# Patient Record
Sex: Female | Born: 1990 | Race: White | Hispanic: No | Marital: Married | Smoking: Never smoker
Health system: Southern US, Community
[De-identification: ages and names within clinical notes are randomized; demographics above are authoritative.]

## PROBLEM LIST (undated history)

## (undated) DIAGNOSIS — Z789 Other specified health status: Secondary | ICD-10-CM

## (undated) HISTORY — PX: APPENDECTOMY: SHX54

---

## 2014-04-27 DIAGNOSIS — F32A Depression, unspecified: Secondary | ICD-10-CM | POA: Insufficient documentation

## 2014-04-27 DIAGNOSIS — O99342 Other mental disorders complicating pregnancy, second trimester: Secondary | ICD-10-CM

## 2014-04-27 DIAGNOSIS — F329 Major depressive disorder, single episode, unspecified: Secondary | ICD-10-CM | POA: Insufficient documentation

## 2014-07-03 ENCOUNTER — Observation Stay: Payer: Self-pay | Admitting: Obstetrics & Gynecology

## 2014-07-11 ENCOUNTER — Inpatient Hospital Stay: Payer: Self-pay | Admitting: Obstetrics and Gynecology

## 2014-07-11 LAB — CBC WITH DIFFERENTIAL/PLATELET
BASOS PCT: 0.4 %
Basophil #: 0 10*3/uL (ref 0.0–0.1)
EOS PCT: 0.3 %
Eosinophil #: 0 10*3/uL (ref 0.0–0.7)
HCT: 32.6 % — ABNORMAL LOW (ref 35.0–47.0)
HGB: 10.6 g/dL — AB (ref 12.0–16.0)
Lymphocyte #: 1.2 10*3/uL (ref 1.0–3.6)
Lymphocyte %: 11.1 %
MCH: 25.1 pg — ABNORMAL LOW (ref 26.0–34.0)
MCHC: 32.4 g/dL (ref 32.0–36.0)
MCV: 77 fL — ABNORMAL LOW (ref 80–100)
Monocyte #: 0.8 x10 3/mm (ref 0.2–0.9)
Monocyte %: 7.2 %
Neutrophil #: 8.8 10*3/uL — ABNORMAL HIGH (ref 1.4–6.5)
Neutrophil %: 81 %
PLATELETS: 351 10*3/uL (ref 150–440)
RBC: 4.21 10*6/uL (ref 3.80–5.20)
RDW: 15.6 % — ABNORMAL HIGH (ref 11.5–14.5)
WBC: 10.9 10*3/uL (ref 3.6–11.0)

## 2014-07-12 LAB — GC/CHLAMYDIA PROBE AMP

## 2014-07-13 LAB — HEMATOCRIT: HCT: 24.2 % — ABNORMAL LOW (ref 35.0–47.0)

## 2014-10-21 NOTE — H&P (Signed)
L&D Evaluation:  History Expanded:  HPI 24 yo G1 at 39 weeks w DFM, some abd pains.  No real ctx's, no vaginal bleeding or ROM.  Prenatal Care at [facility].  Plans to adopt out baby.  Late entry to care and depression this pregnancy.   Gravida 1   Term [facility]   [facility]   Presents with decreased fetal movement   Patient's Medical History No Chronic Illness   Patient's Surgical History none   Medications Pre Natal Vitamins   Allergies NKDA   Social History none   Family History Non-Contributory   ROS:  ROS All systems were reviewed.  HEENT, CNS, GI, GU, Respiratory, CV, Renal and Musculoskeletal systems were found to be normal.   Exam:  Vital Signs stable   General no apparent distress   Mental Status clear   Abdomen gravid, non-tender   Estimated Fetal Weight Average for gestational age   Back no CVAT   Edema no edema   Pelvic no external lesions, 1/70/-2   Mebranes Intact   FHT normal rate with no decels, Non-Stress Test R   Ucx absent   Skin dry   Impression:  Impression decreased fetal movement   Plan:  Plan A NST procedure was performed with FHR monitoring and a normal baseline established, appropriate time of 20-40 minutes of evaluation, and accels >2 seen w 15x15 characteristics.  Results show a REACTIVE Non-Stress Test.   Comments No signs or symptoms of labor   Precautions discussed.   Electronic Signatures: Letitia LibraHarris, Cyndal Kasson Paul (MD)  (Signed 21-Jan-16 20:12)  Authored: L&D Evaluation   Last Updated: 21-Jan-16 20:12 by Letitia LibraHarris, Jahziel Sinn Paul (MD)

## 2014-10-21 NOTE — H&P (Signed)
L&D Evaluation:  History Expanded:  HPI 24 yo G1 at 4934w2d gestational age by LMP consistent with 285w4d gestational age, regnancy complicated by some depression during the pregnancy and late entry to care at 24 weeks. She presents with gush of fluid at 145pm today of clear fluid initially and has continued to leak a pink-tinged fluid, contractions starting at about 415pm today.  Apart from the pink-tinged fluid she denies frank bleeding.  Prenatal Care at [facility].  Plans to adopt out baby.   Gravida 1   Term 0   Blood Type (Maternal) A positive   Group B Strep Results Maternal (Result >5wks must be treated as unknown) positive   Maternal HIV Negative   Maternal Syphilis Ab Nonreactive   Maternal Varicella Immune   Rubella Results (Maternal) immune   [facility]   Patient's Medical History No Chronic Illness   Patient's Surgical History none   Medications Pre Natal Vitamins   Allergies ranitidine - hives   Social History none  EtOH use outside of pregnancy (occasional), baby up for adoption   Family History Non-Contributory   ROS:  ROS All systems were reviewed.  HEENT, CNS, GI, GU, Respiratory, CV, Renal and Musculoskeletal systems were found to be normal.   Exam:  Vital Signs stable  normotensive, afebrile   General no apparent distress   Mental Status clear   Chest clear   Heart normal sinus rhythm   Abdomen gravid, non-tender   Estimated Fetal Weight Average for gestational age, 7 pounds   Fetal Position ceph   Back no CVAT   Edema no edema   Pelvic no external lesions, 3cm per RN   Mebranes Ruptured, grossly   Description clear   FHT normal rate with no decels, 130/mod var/+accels/no decels   Ucx irregular, 2-3 q 10 min   Skin dry   Impression:  Impression decreased fetal movement   Plan:  Plan 1) Intrauterine pregnancy at 7734w2d gestational age, 2) SROM, 3) labor   Comments 1) Labor: expectant management  2) Fetus - category I  tracing  3) PNL A positive/ ABSC negative / RI / VZI / HIV neg / RPR NR / HBsAg neg / 1-hr OGTT 140 - 3 hour all normal / GBS positive / total weight gain this pregnancy - about 17 pounds, but patient presented at 24 weeks.  4) TDAP/flu vaccines given 04/18/14  5) Social: adoptive parents present. Paperwork regarding SW contacts and patient wishes while in hospital in front of chart.  All reviewed.    6) Disposition - home postpartum   Labs:  Lab Results: Routine Hem:  29-Jan-16 17:18   WBC (CBC) 10.9  Hemoglobin (CBC)  10.6  Hematocrit (CBC)  32.6  Platelet Count (CBC) 351   Electronic Signatures: Conard NovakJackson, Derhonda Eastlick D (MD)  (Signed 29-Jan-16 19:00)  Authored: L&D Evaluation, Labs   Last Updated: 29-Jan-16 19:00 by Conard NovakJackson, Jovanny Stephanie D (MD)

## 2015-07-20 ENCOUNTER — Emergency Department
Admission: EM | Admit: 2015-07-20 | Discharge: 2015-07-20 | Disposition: A | Discharge status: Home / Self Care | Payer: Self-pay

## 2015-07-25 ENCOUNTER — Encounter: Payer: Self-pay | Admitting: Emergency Medicine

## 2015-07-25 DIAGNOSIS — N12 Tubulo-interstitial nephritis, not specified as acute or chronic: Secondary | ICD-10-CM | POA: Insufficient documentation

## 2015-07-25 DIAGNOSIS — Z3202 Encounter for pregnancy test, result negative: Secondary | ICD-10-CM | POA: Insufficient documentation

## 2015-07-25 NOTE — ED Notes (Signed)
Pt. States fever for the past 3 days treated with OTC medication.  Pt. States lower right back pain for the past 3 days.

## 2015-07-26 ENCOUNTER — Emergency Department
Admission: EM | Admit: 2015-07-26 | Discharge: 2015-07-26 | Disposition: A | Discharge status: Home / Self Care | Payer: Self-pay | Attending: Student | Admitting: Student

## 2015-07-26 DIAGNOSIS — N12 Tubulo-interstitial nephritis, not specified as acute or chronic: Secondary | ICD-10-CM

## 2015-07-26 LAB — URINALYSIS COMPLETE WITH MICROSCOPIC (ARMC ONLY)
Bilirubin Urine: NEGATIVE
Glucose, UA: 50 mg/dL — AB
KETONES UR: NEGATIVE mg/dL
NITRITE: POSITIVE — AB
PH: 5 (ref 5.0–8.0)
Protein, ur: 100 mg/dL — AB
Specific Gravity, Urine: 1.016 (ref 1.005–1.030)

## 2015-07-26 LAB — PREGNANCY, URINE: PREG TEST UR: NEGATIVE

## 2015-07-26 MED ORDER — CEPHALEXIN 500 MG PO CAPS: 500.0000 mg | ORAL_CAPSULE | Freq: Four times a day (QID) | ORAL | Status: DC

## 2015-07-26 MED ORDER — CEPHALEXIN 500 MG PO CAPS
500.0000 mg | ORAL_CAPSULE | Freq: Once | ORAL | Status: AC
  Administered 2015-07-26: 500 mg via ORAL
  Filled 2015-07-26: qty 1

## 2015-07-26 NOTE — ED Provider Notes (Signed)
[facility] Emergency Department Provider Note  ____________________________________________  Time seen: Approximately 12:50 AM  I have reviewed the triage vital signs and the nursing notes.   HISTORY  Chief Complaint Fever and Back Pain    HPI Karen Wilcox is a 25 y.o. female with no chronic medical problems who presents for evaluation of constant right flank pain and fever as well as dysuria for 3 days, gradual onset, constant since onset, currently moderate, no modifying factors. No cough, sneezing, runny nose, congestion, vomiting, diarrhea or abdominal pain. No chest pain or difficulty breathing.   History reviewed. No pertinent past medical history.  There are no active problems to display for this patient.   History reviewed. No pertinent past surgical history.  No current outpatient prescriptions on file.  Allergies Zantac  History reviewed. No pertinent family history.  Social History Social History  Substance Use Topics  . Smoking status: Never Smoker   . Smokeless tobacco: None  . Alcohol Use: No    Review of Systems Constitutional: No fever/chills Eyes: No visual changes. ENT: No sore throat. Cardiovascular: Denies chest pain. Respiratory: Denies shortness of breath. Gastrointestinal: No abdominal pain.  No nausea, no vomiting.  No diarrhea.  No constipation. Genitourinary: Positive for dysuria. Musculoskeletal: Positive for right flank pain. Skin: Negative for rash. Neurological: Negative for headaches, focal weakness or numbness.  10-point ROS otherwise negative.  ____________________________________________   PHYSICAL EXAM:  VITAL SIGNS: ED Triage Vitals  Enc Vitals Group     BP 07/25/15 2211 121/66 mmHg     Pulse Rate 07/25/15 2211 131     Resp 07/25/15 2211 22     Temp 07/25/15 2211 99.2 F (37.3 C)     Temp Source 07/25/15 2211 Oral     SpO2 07/25/15 2211 96 %     Weight 07/25/15 2211 100 lb (45.36  kg)     Height 07/25/15 2211  (1.626 m)     Head Cir --      Peak Flow --      Pain Score 07/25/15 2220 10     Pain Loc --      Pain Edu? --      Excl. in GC? --     Constitutional: Alert and oriented. Well appearing and in no acute distress. Eyes: Conjunctivae are normal. PERRL. EOMI. Head: Atraumatic. Nose: No congestion/rhinnorhea. Mouth/Throat: Mucous membranes are moist.  Oropharynx non-erythematous. Neck: No stridor.  Supple without meningismus. Cardiovascular: Normal rate, regular rhythm. Grossly normal heart sounds.  Good peripheral circulation. Respiratory: Normal respiratory effort.  No retractions. Lungs CTAB. Gastrointestinal: Soft and nontender. No distention.  + right CVA tenderness. Genitourinary: deferred Musculoskeletal: No lower extremity tenderness nor edema.  No joint effusions.  Neurologic:  Normal speech and language. No gross focal neurologic deficits are appreciated. No gait instability. Skin:  Skin is warm, dry and intact. No rash noted. Psychiatric: Mood and affect are normal. Speech and behavior are normal.  ____________________________________________   LABS (all labs ordered are listed, but only abnormal results are displayed)  Labs Reviewed  URINALYSIS COMPLETEWITH MICROSCOPIC ([facility] ONLY) - Abnormal; Notable for the following:    Color, Urine YELLOW (*)    APPearance CLOUDY (*)    Glucose, UA 50 (*)    Hgb urine dipstick 3+ (*)    Protein, ur 100 (*)    Nitrite POSITIVE (*)    Leukocytes, UA 3+ (*)    Bacteria, UA MANY (*)    Squamous Epithelial /  LPF 0-5 (*)    All other components within normal limits  URINE CULTURE   ____________________________________________  EKG  none ____________________________________________  RADIOLOGY  none ____________________________________________   PROCEDURES  Procedure(s) performed: None  Critical Care performed: No  ____________________________________________   INITIAL IMPRESSION  / ASSESSMENT AND PLAN / ED COURSE  Pertinent labs & imaging results that were available during my care of the patient were reviewed by me and considered in my medical decision making (see chart for details).  Karen Wilcox is a 25 y.o. female with no chronic medical problems who presents for evaluation of right flank pain and fever as well as dysuria for 3 days. On exam, she is nontoxic appearing and in no acute distress. She was initially tachycardic when her triage vitals were taken however this has resolved at the time of my assessment without any intervention. She is afebrile and vital signs are stable. She does have some right CVA tenderness. Urinalysis is concerning for infection with 3+ leukocytes, too numerous to count red blood cells, white blood cells and many bacteria as well as white blood cells in clumps. Suspect acute pyelonephritis. We'll DC with Keflex, return precautions and close PCP follow-up here she is comfortable with the discharge plan. DC home. Upreg negative. ____________________________________________   FINAL CLINICAL IMPRESSION(S) / ED DIAGNOSES  Final diagnoses:  Pyelonephritis      Gayla Doss, MD 07/26/15 [phone]

## 2015-07-28 LAB — URINE CULTURE: Culture: >=100000

## 2016-05-22 ENCOUNTER — Observation Stay
Admission: EM | Admit: 2016-05-22 | Discharge: 2016-05-23 | Disposition: A | Discharge status: Home / Self Care | Payer: Medicaid Other | Attending: General Surgery | Admitting: General Surgery

## 2016-05-22 ENCOUNTER — Emergency Department: Payer: Medicaid Other | Admitting: Certified Registered Nurse Anesthetist

## 2016-05-22 ENCOUNTER — Encounter: Payer: Self-pay | Admitting: Emergency Medicine

## 2016-05-22 ENCOUNTER — Encounter
Admission: EM | Disposition: A | Discharge status: Home / Self Care | Payer: Self-pay | Source: Home / Self Care | Attending: Emergency Medicine

## 2016-05-22 ENCOUNTER — Emergency Department: Payer: Medicaid Other

## 2016-05-22 DIAGNOSIS — K353 Acute appendicitis with localized peritonitis, without perforation or gangrene: Secondary | ICD-10-CM

## 2016-05-22 DIAGNOSIS — K37 Unspecified appendicitis: Secondary | ICD-10-CM | POA: Diagnosis present

## 2016-05-22 DIAGNOSIS — R1031 Right lower quadrant pain: Secondary | ICD-10-CM

## 2016-05-22 HISTORY — PX: LAPAROSCOPIC APPENDECTOMY: SHX408

## 2016-05-22 LAB — URINALYSIS, COMPLETE (UACMP) WITH MICROSCOPIC
BILIRUBIN URINE: NEGATIVE
Bacteria, UA: NONE SEEN
GLUCOSE, UA: NEGATIVE mg/dL
KETONES UR: 80 mg/dL — AB
Nitrite: NEGATIVE
PH: 5 (ref 5.0–8.0)
Protein, ur: NEGATIVE mg/dL
Specific Gravity, Urine: 1.02 (ref 1.005–1.030)

## 2016-05-22 LAB — COMPREHENSIVE METABOLIC PANEL
ALT: 10 U/L — AB (ref 14–54)
ANION GAP: 7 (ref 5–15)
AST: 20 U/L (ref 15–41)
Albumin: 4.9 g/dL (ref 3.5–5.0)
Alkaline Phosphatase: 54 U/L (ref 38–126)
BUN: 13 mg/dL (ref 6–20)
CHLORIDE: 106 mmol/L (ref 101–111)
CO2: 25 mmol/L (ref 22–32)
CREATININE: 0.48 mg/dL (ref 0.44–1.00)
Calcium: 9.5 mg/dL (ref 8.9–10.3)
Glucose, Bld: 119 mg/dL — ABNORMAL HIGH (ref 65–99)
POTASSIUM: 3.6 mmol/L (ref 3.5–5.1)
Sodium: 138 mmol/L (ref 135–145)
Total Bilirubin: 0.5 mg/dL (ref 0.3–1.2)
Total Protein: 8.4 g/dL — ABNORMAL HIGH (ref 6.5–8.1)

## 2016-05-22 LAB — CBC
HCT: 37.5 % (ref 35.0–47.0)
Hemoglobin: 12 g/dL (ref 12.0–16.0)
MCH: 22.7 pg — ABNORMAL LOW (ref 26.0–34.0)
MCHC: 31.9 g/dL — ABNORMAL LOW (ref 32.0–36.0)
MCV: 71.2 fL — AB (ref 80.0–100.0)
PLATELETS: 380 10*3/uL (ref 150–440)
RBC: 5.28 MIL/uL — AB (ref 3.80–5.20)
RDW: 17.7 % — ABNORMAL HIGH (ref 11.5–14.5)
WBC: 18 10*3/uL — AB (ref 3.6–11.0)

## 2016-05-22 LAB — LIPASE, BLOOD: LIPASE: 22 U/L (ref 11–51)

## 2016-05-22 LAB — POCT PREGNANCY, URINE: PREG TEST UR: NEGATIVE

## 2016-05-22 LAB — HCG, QUANTITATIVE, PREGNANCY: hCG, Beta Chain, Quant, S: <1 m[IU]/mL (ref <5)

## 2016-05-22 SURGERY — APPENDECTOMY, LAPAROSCOPIC
Anesthesia: General | Wound class: Clean Contaminated

## 2016-05-22 MED ORDER — KETOROLAC TROMETHAMINE 30 MG/ML IJ SOLN
INTRAMUSCULAR | Status: DC | PRN
Start: 2016-05-22 — End: 2016-05-22
  Administered 2016-05-22: 30 mg via INTRAVENOUS

## 2016-05-22 MED ORDER — ROCURONIUM BROMIDE 100 MG/10ML IV SOLN
INTRAVENOUS | Status: DC | PRN
  Administered 2016-05-22: 30 mg via INTRAVENOUS

## 2016-05-22 MED ORDER — ONDANSETRON HCL 4 MG/2ML IJ SOLN: 4.0000 mg | Freq: Once | INTRAMUSCULAR | Status: DC | PRN

## 2016-05-22 MED ORDER — FENTANYL CITRATE (PF) 100 MCG/2ML IJ SOLN
50.0000 ug | Freq: Once | INTRAMUSCULAR | Status: AC
Start: 2016-05-22 — End: 2016-05-22
  Administered 2016-05-22: 50 ug via INTRAVENOUS
  Filled 2016-05-22: qty 1

## 2016-05-22 MED ORDER — MORPHINE SULFATE (PF) 4 MG/ML IV SOLN
INTRAVENOUS | Status: AC
  Administered 2016-05-22: 4 mg via INTRAVENOUS
  Filled 2016-05-22: qty 1

## 2016-05-22 MED ORDER — FENTANYL CITRATE (PF) 100 MCG/2ML IJ SOLN
INTRAMUSCULAR | Status: AC
  Administered 2016-05-22: 50 ug via INTRAVENOUS
  Filled 2016-05-22: qty 2

## 2016-05-22 MED ORDER — PIPERACILLIN-TAZOBACTAM 3.375 G IVPB 30 MIN
3.3750 g | Freq: Once | INTRAVENOUS | Status: AC
  Administered 2016-05-22: 3.375 g via INTRAVENOUS
  Filled 2016-05-22: qty 50

## 2016-05-22 MED ORDER — SODIUM CHLORIDE 0.9 % IV BOLUS (SEPSIS)
1000.0000 mL | Freq: Once | INTRAVENOUS | Status: AC
  Administered 2016-05-22: 1000 mL via INTRAVENOUS

## 2016-05-22 MED ORDER — LACTATED RINGERS IV SOLN
INTRAVENOUS | Status: DC | PRN
  Administered 2016-05-22: 23:00:00 via INTRAVENOUS

## 2016-05-22 MED ORDER — PIPERACILLIN-TAZOBACTAM 3.375 G IVPB
INTRAVENOUS | Status: AC
  Administered 2016-05-22: 3.375 g via INTRAVENOUS
  Filled 2016-05-22: qty 50

## 2016-05-22 MED ORDER — LIDOCAINE HCL 1 % IJ SOLN
INTRAMUSCULAR | Status: DC | PRN
  Administered 2016-05-22: 7.5 mL

## 2016-05-22 MED ORDER — ACETAMINOPHEN 10 MG/ML IV SOLN
INTRAVENOUS | Status: DC | PRN
  Administered 2016-05-22: 1000 mg via INTRAVENOUS

## 2016-05-22 MED ORDER — BUPIVACAINE-EPINEPHRINE (PF) 0.25% -1:200000 IJ SOLN
INTRAMUSCULAR | Status: AC
  Filled 2016-05-22: qty 30

## 2016-05-22 MED ORDER — ONDANSETRON HCL 4 MG/2ML IJ SOLN
INTRAMUSCULAR | Status: AC
  Filled 2016-05-22: qty 2

## 2016-05-22 MED ORDER — FENTANYL CITRATE (PF) 100 MCG/2ML IJ SOLN
INTRAMUSCULAR | Status: DC
Start: 2016-05-22 — End: 2016-05-23
  Filled 2016-05-22: qty 2

## 2016-05-22 MED ORDER — DEXAMETHASONE SODIUM PHOSPHATE 10 MG/ML IJ SOLN
INTRAMUSCULAR | Status: DC | PRN
  Administered 2016-05-22: 5 mg via INTRAVENOUS

## 2016-05-22 MED ORDER — FENTANYL CITRATE (PF) 100 MCG/2ML IJ SOLN
INTRAMUSCULAR | Status: DC | PRN
  Administered 2016-05-22 (×2): 50 ug via INTRAVENOUS

## 2016-05-22 MED ORDER — MIDAZOLAM HCL 2 MG/2ML IJ SOLN
INTRAMUSCULAR | Status: DC | PRN
  Administered 2016-05-22: 2 mg via INTRAVENOUS

## 2016-05-22 MED ORDER — ONDANSETRON HCL 4 MG/2ML IJ SOLN
INTRAMUSCULAR | Status: DC | PRN
  Administered 2016-05-22: 4 mg via INTRAVENOUS

## 2016-05-22 MED ORDER — ACETAMINOPHEN 10 MG/ML IV SOLN
INTRAVENOUS | Status: AC
  Filled 2016-05-22: qty 100

## 2016-05-22 MED ORDER — PROPOFOL 10 MG/ML IV BOLUS
INTRAVENOUS | Status: DC | PRN
  Administered 2016-05-22: 150 mg via INTRAVENOUS

## 2016-05-22 MED ORDER — MORPHINE SULFATE (PF) 4 MG/ML IV SOLN
4.0000 mg | Freq: Once | INTRAVENOUS | Status: AC
Start: 2016-05-22 — End: 2016-05-22
  Administered 2016-05-22: 4 mg via INTRAVENOUS

## 2016-05-22 MED ORDER — SUGAMMADEX SODIUM 500 MG/5ML IV SOLN
INTRAVENOUS | Status: DC | PRN
  Administered 2016-05-22: 100 mg via INTRAVENOUS

## 2016-05-22 MED ORDER — BUPIVACAINE-EPINEPHRINE (PF) 0.25% -1:200000 IJ SOLN
INTRAMUSCULAR | Status: DC | PRN
  Administered 2016-05-22: 7.5 mL via PERINEURAL

## 2016-05-22 MED ORDER — LIDOCAINE HCL (CARDIAC) 20 MG/ML IV SOLN
INTRAVENOUS | Status: DC | PRN
  Administered 2016-05-22: 50 mg via INTRAVENOUS

## 2016-05-22 MED ORDER — LIDOCAINE HCL (PF) 1 % IJ SOLN
INTRAMUSCULAR | Status: AC
  Filled 2016-05-22: qty 30

## 2016-05-22 MED ORDER — ONDANSETRON HCL 4 MG/2ML IJ SOLN
4.0000 mg | Freq: Once | INTRAMUSCULAR | Status: AC | PRN
  Administered 2016-05-22: 4 mg via INTRAVENOUS

## 2016-05-22 MED ORDER — FENTANYL CITRATE (PF) 100 MCG/2ML IJ SOLN
25.0000 ug | INTRAMUSCULAR | Status: DC | PRN
  Administered 2016-05-22: 25 ug via INTRAVENOUS

## 2016-05-22 MED ORDER — IOPAMIDOL (ISOVUE-300) INJECTION 61%
75.0000 mL | Freq: Once | INTRAVENOUS | Status: AC | PRN
  Administered 2016-05-22: 75 mL via INTRAVENOUS
  Filled 2016-05-22: qty 75

## 2016-05-22 SURGICAL SUPPLY — 42 items
ADHESIVE MASTISOL STRL (MISCELLANEOUS) ×2 IMPLANT
APPLIER CLIP 5 13 M/L LIGAMAX5 (MISCELLANEOUS) ×2
BLADE SURG SZ11 CARB STEEL (BLADE) ×2 IMPLANT
BULB RESERV EVAC DRAIN JP 100C (MISCELLANEOUS) IMPLANT
CANISTER SUCT 1200ML W/VALVE (MISCELLANEOUS) ×2 IMPLANT
CATH TRAY 16F METER LATEX (MISCELLANEOUS) ×2 IMPLANT
CHLORAPREP W/TINT 26ML (MISCELLANEOUS) ×2 IMPLANT
CLIP APPLIE 5 13 M/L LIGAMAX5 (MISCELLANEOUS) ×1 IMPLANT
CUTTER FLEX LINEAR 45M (STAPLE) ×2 IMPLANT
DRAIN CHANNEL JP 19F (MISCELLANEOUS) IMPLANT
DRESSING TELFA 4X3 1S ST N-ADH (GAUZE/BANDAGES/DRESSINGS) ×2 IMPLANT
DRSG TEGADERM 2-3/8X2-3/4 SM (GAUZE/BANDAGES/DRESSINGS) ×2 IMPLANT
DRSG TELFA 3X8 NADH (GAUZE/BANDAGES/DRESSINGS) ×2 IMPLANT
ELECT REM PT RETURN 9FT ADLT (ELECTROSURGICAL) ×2
ELECTRODE REM PT RTRN 9FT ADLT (ELECTROSURGICAL) ×1 IMPLANT
ENDOPOUCH RETRIEVER 10 (MISCELLANEOUS) IMPLANT
GLOVE BIO SURGEON STRL SZ7.5 (GLOVE) ×6 IMPLANT
GLOVE INDICATOR 8.0 STRL GRN (GLOVE) ×6 IMPLANT
GOWN STRL REUS W/ TWL LRG LVL3 (GOWN DISPOSABLE) ×2 IMPLANT
GOWN STRL REUS W/TWL LRG LVL3 (GOWN DISPOSABLE) ×2
IRRIGATION STRYKERFLOW (MISCELLANEOUS) IMPLANT
IRRIGATOR STRYKERFLOW (MISCELLANEOUS)
IV NS 1000ML (IV SOLUTION) ×1
IV NS 1000ML BAXH (IV SOLUTION) ×1 IMPLANT
KIT RM TURNOVER STRD PROC AR (KITS) ×2 IMPLANT
LABEL OR SOLS (LABEL) ×2 IMPLANT
NEEDLE HYPO 25X1 1.5 SAFETY (NEEDLE) ×2 IMPLANT
NEEDLE VERESS 14GA 120MM (NEEDLE) ×2 IMPLANT
NS IRRIG 500ML POUR BTL (IV SOLUTION) ×2 IMPLANT
PACK LAP CHOLECYSTECTOMY (MISCELLANEOUS) ×2 IMPLANT
RELOAD 45 VASCULAR/THIN (ENDOMECHANICALS) ×2 IMPLANT
RELOAD STAPLE TA45 3.5 REG BLU (ENDOMECHANICALS) ×2 IMPLANT
SET SUCTION IRRIG HYDROSURG (IRRIGATION / IRRIGATOR) ×2 IMPLANT
SLEEVE ENDOPATH XCEL 5M (ENDOMECHANICALS) ×2 IMPLANT
STRIP CLOSURE SKIN 1/2X4 (GAUZE/BANDAGES/DRESSINGS) ×2 IMPLANT
SUT MNCRL 4-0 (SUTURE) ×1
SUT MNCRL 4-0 27XMFL (SUTURE) ×1
SUT VICRYL 0 AB UR-6 (SUTURE) ×2 IMPLANT
SUTURE MNCRL 4-0 27XMF (SUTURE) ×1 IMPLANT
TROCAR XCEL 12X100 BLDLESS (ENDOMECHANICALS) ×2 IMPLANT
TROCAR XCEL NON-BLD 5MMX100MML (ENDOMECHANICALS) ×2 IMPLANT
TUBING INSUFFLATOR HI FLOW (MISCELLANEOUS) ×2 IMPLANT

## 2016-05-22 NOTE — Anesthesia Preprocedure Evaluation (Signed)
Anesthesia Evaluation  Patient identified by MRN, date of birth, ID band Patient awake    Reviewed: Allergy & Precautions, H&P , NPO status , Patient's Chart, lab work & pertinent test results, reviewed documented beta blocker date and time   Airway Mallampati: II  TM Distance: >3 FB Neck ROM: full    Dental  (+) Teeth Intact   Pulmonary neg pulmonary ROS,    Pulmonary exam normal        Cardiovascular negative cardio ROS Normal cardiovascular exam Rhythm:regular Rate:Normal     Neuro/Psych negative neurological ROS  negative psych ROS   GI/Hepatic negative GI ROS, Neg liver ROS,   Endo/Other  negative endocrine ROS  Renal/GU negative Renal ROS  negative genitourinary   Musculoskeletal   Abdominal   Peds  Hematology negative hematology ROS (+)   Anesthesia Other Findings History reviewed. No pertinent past medical history. History reviewed. No pertinent surgical history. BMI    Body Mass Index:  18.02 kg/m     Reproductive/Obstetrics negative OB ROS                             Anesthesia Physical Anesthesia Plan  ASA: II  Anesthesia Plan: General ETT   Post-op Pain Management:    Induction:   Airway Management Planned:   Additional Equipment:   Intra-op Plan:   Post-operative Plan:   Informed Consent: I have reviewed the patients History and Physical, chart, labs and discussed the procedure including the risks, benefits and alternatives for the proposed anesthesia with the patient or authorized representative who has indicated his/her understanding and acceptance.   Dental Advisory Given  Plan Discussed with: CRNA  Anesthesia Plan Comments:         Anesthesia Quick Evaluation

## 2016-05-22 NOTE — Brief Op Note (Signed)
05/22/2016  11:22 PM  PATIENT:  Karen Wilcox  25 y.o. female  PRE-OPERATIVE DIAGNOSIS:  acute appendicitis  POST-OPERATIVE DIAGNOSIS:  acute appendicitis  PROCEDURE:  Procedure(s): APPENDECTOMY LAPAROSCOPIC (N/A)  SURGEON:  Surgeon(s) and Role:    * Ricarda Frameharles Keyera Hattabaugh, MD - Primary  PHYSICIAN ASSISTANT:   ASSISTANTS: none   ANESTHESIA:   general  EBL:  Total I/O In: -  Out: 3 [Blood:3]  BLOOD ADMINISTERED:none  DRAINS: none   LOCAL MEDICATIONS USED:  MARCAINE   , XYLOCAINE  and Amount: 15 ml  SPECIMEN:  Source of Specimen:  appendix  DISPOSITION OF SPECIMEN:  PATHOLOGY  COUNTS:  YES  TOURNIQUET:  * No tourniquets in log *  DICTATION: .Dragon Dictation  PLAN OF CARE: Admit for overnight observation  PATIENT DISPOSITION:  PACU - hemodynamically stable.   Delay start of Pharmacological VTE agent (>24hrs) due to surgical blood loss or risk of bleeding: no

## 2016-05-22 NOTE — ED Notes (Signed)
Informed consent obtained 

## 2016-05-22 NOTE — ED Notes (Signed)
This RN called CT and per Dr. Don PerkingVeronese, pt is to go to CT without having had oral contrast.

## 2016-05-22 NOTE — ED Triage Notes (Addendum)
Patient reports RLQ abdominal pain that started this morning. States approx. 2 hours ago pain became significantly worse. Patient reports nausea and vomiting. Patient actively vomiting in triage. Denies diarrhea, fever. Patient denies history of similar symptoms.

## 2016-05-22 NOTE — ED Notes (Signed)
Lights dimmed for patient comfort. Pt resting in bed. Pt's husband at bedside.

## 2016-05-22 NOTE — H&P (Signed)
Patient ID: Karen Wilcox, female   DOB: [birth year], 25 y.o.   MRN: [id number]  CC: ABDOMINAL PAIN  HPI Karen Wilcox is a 25 y.o. female who presents to emergency department with one-day history of abdominal pain. Patient reports that the pain started approximately 11:00 this morning. She works early this morning and would go to sleep after the pain started. She woke and 4:00 this afternoon with a sudden worsening of her discomfort prompted her to come to the emergency department. She reports never had anything like this before. She is unable to state anything that worsens or makes this pain better. She developed nausea and vomiting after reporting to the ER but not before. Her last bowel movement was this morning was normal. She states she is very hungry. The pain is only in her right lower quadrant and has not moved it does not radiate. Patient states that she's had some chills at home but also states she is constantly cold. She denies any fevers, chest pain, shortness of breath, diarrhea, constipation.  HPI  History reviewed. No pertinent past medical history. Currently on no outpatient medications. Only medical history from this year has been of a urinary tract infection treated with oral antibiotics 6 months ago.  History reviewed. No pertinent surgical history. Patient has never had any abdominal surgery.  No family history on file. Patient denies any family history of cancer, heart disease, diabetes.  Social History Social History  Substance Use Topics  . Smoking status: Never Smoker  . Smokeless tobacco: Never Used  . Alcohol use No    Allergies  Allergen Reactions  . Zantac [Ranitidine Hcl] Hives    Current Facility-Administered Medications  Medication Dose Route Frequency Provider Last Rate Last Dose  . ondansetron (ZOFRAN) 4 MG/2ML injection           . piperacillin-tazobactam (ZOSYN) IVPB 3.375 g  3.375 g Intravenous Once [place] Verones[place], MD 100 mL/hr at 05/22/16  2045 3.375 g at 05/22/16 2045   Current Outpatient Prescriptions  Medication Sig Dispense Refill  . cephALEXin (KEFLEX) 500 MG capsule Take 1 capsule (500 mg total) by mouth 4 (four) times daily. 28 capsule 0     Review of Systems A Multi-point review of systems was asked and was negative except for the findings documented in the history of present illness  Physical Exam Blood pressure 117/70, pulse 93, temperature 98.3 F (36.8 C), temperature source Oral, resp. rate 19, height 5\' 4"  (1.626 m), weight 47.6 kg (105 lb), last menstrual period 05/15/2016, SpO2 100 %. CONSTITUTIONAL: No acute distress. EYES: Pupils are equal, round, and reactive to light, Sclera are non-icteric. EARS, NOSE, MOUTH AND THROAT: The oropharynx is clear. The oral mucosa is pink and moist. Hearing is intact to voice. LYMPH NODES:  Lymph nodes in the neck are normal. RESPIRATORY:  Lungs are clear. There is normal respiratory effort, with equal breath sounds bilaterally, and without pathologic use of accessory muscles. CARDIOVASCULAR: Heart is regular without murmurs, gallops, or rubs. GI: The abdomen is soft, tender to palpation in the right lower quadrant, and nondistended. There is no rebound, guarding, tenderness outside the right lower quadrant. The pain extends down to her right inguinal ligament. There are no palpable masses. There is no hepatosplenomegaly. There are normal bowel sounds in all quadrants. GU: Rectal deferred.   MUSCULOSKELETAL: Normal muscle strength and tone. No cyanosis or edema.   SKIN: Turgor is good and there are no pathologic skin lesions or ulcers. NEUROLOGIC: Motor and sensation is  grossly normal. Cranial nerves are grossly intact. PSYCH:  Oriented to person, place and time. Affect is normal.  Data Reviewed Images and labs reviewed, labs showed leukocytosis of 18,000 but are otherwise within normal limits. Only imaging currently available as ultrasound which shows a possible thickened  structure in the right lower quadrant that could be the appendix.CT scan of the abdomen does show a dilated and thickened appendix going into the right pelvis. This is consistent with appendicitis I have personally reviewed the patient's imaging, laboratory findings and medical records.    Assessment    Right lower quadrant abdominal pain.    Plan    25 year old female with right lower quadrant abdominal pain and localized tenderness. Possible appendicitis. CT scan shows likely appendicitis. Treatment options of a laparoscopic appendectomy versus admission with antibiotics were discussed with the patient. The procedure of a lap scopic appendectomy as described in detail to include the risks, benefits, alternatives. Patient voiced understanding and desire to proceed to the operating room for an appendectomy. Plan to take to the operating room from the ER. She'll be brought into the hospital under observation postoperatively.     Time spent with the patient was 45 minutes, with more than 50% of the time spent in face-to-face education, counseling and care coordination.     Ricarda Frameharles Lashondra Vaquerano, MD FACS General Surgeon 05/22/2016, 8:55 PM

## 2016-05-22 NOTE — Op Note (Signed)
laparascopic appendectomy   Karen Wilcox Date of operation:  05/22/2016  Indications: The patient presented with a history of  abdominal pain. Workup has revealed findings consistent with acute appendicitis.  Pre-operative Diagnosis: Acute appendicitis without mention of peritonitis  Post-operative Diagnosis: Same  Surgeon: Leonette Mostharles T. Tonita CongWoodham, MD, FACS  Anesthesia: General with endotracheal tube  Procedure Details  The patient was seen again in the preop area. The options of surgery versus observation were reviewed with the patient and/or family. The risks of bleeding, infection, recurrence of symptoms, negative laparoscopy, potential for an open procedure, bowel injury, abscess or infection, were all reviewed as well. The patient was taken to Operating Room, identified as Karen Wilcox and the procedure verified as laparoscopic appendectomy. A Time Out was held and the above information confirmed.  The patient was placed in the supine position and general anesthesia was induced.  Antibiotic prophylaxis was administered and VT E prophylaxis was in place. A Foley catheter was placed by the nursing staff.   The abdomen was prepped and draped in a sterile fashion. An infraumbilical incision was made. A Veress needle was placed and pneumoperitoneum was obtained. A 5 mm trocar port was placed without difficulty and the abdominal cavity was explored.  Under direct vision a 5 mm suprapubic port was placed and a 12 mm left lateral port was placed all under direct vision.  The appendix was identified and found to be acutely inflamed in the pelvis tucked behind the right ovary.  The appendix was carefully dissected. The base of the appendix was dissected out and divided with a standard load Endo GIA. The mesoappendix was divided with a vascular load Endo GIA. There was visible pulsatile bleeding after the vascular load and hemostasis was obtained using the endoclip applier.  The appendix was  passed out through the left lateral port site with the aid of an Endo Catch bag. The right lower quadrant and pelvis was then irrigated with copious amounts of normal saline which was aspirated. Inspection  failed to identify any additional bleeding and there were no signs of bowel injury.  Again the right lower quadrant was inspected there was no sign of bleeding or bowel injury therefore pneumoperitoneum was released, all ports were removed and the fascia was closed with a figure of eight stitch with a 0-vicryl and skin incisions were approximated with subcuticular 4-0 Monocryl. Steri-Strips and Mastisol and sterile dressings were placed.  The patient tolerated the procedure well, there were no complications. The sponge lap and needle count were correct at the end of the procedure.  The patient was taken to the recovery room in stable condition to be admitted for continued care.  Findings: Acute appendicitis  Estimated Blood Loss: 10 mL                  Specimens: appendix         Complications:  None                  Ricarda Frameharles Tywanna Seifer MD, FACS

## 2016-05-22 NOTE — Anesthesia Procedure Notes (Signed)
Procedure Name: Intubation Date/Time: 05/22/2016 10:34 PM Performed by: Ginger CarneMICHELET, Masayo Fera Pre-anesthesia Checklist: Patient identified, Emergency Drugs available, Suction available, Patient being monitored and Timeout performed Patient Re-evaluated:Patient Re-evaluated prior to inductionOxygen Delivery Method: Circle system utilized Preoxygenation: Pre-oxygenation with 100% oxygen Intubation Type: IV induction and Cricoid Pressure applied Ventilation: Mask ventilation without difficulty Laryngoscope Size: Miller and 2 Grade View: Grade I Tube type: Oral Tube size: 7.0 mm Number of attempts: 1 Airway Equipment and Method: Stylet Placement Confirmation: ETT inserted through vocal cords under direct vision,  positive ETCO2 and breath sounds checked- equal and bilateral Secured at: 20 cm Tube secured with: Tape Dental Injury: Teeth and Oropharynx as per pre-operative assessment

## 2016-05-22 NOTE — ED Notes (Signed)
NS one litre at 20:40

## 2016-05-22 NOTE — Transfer of Care (Signed)
Immediate Anesthesia Transfer of Care Note  Patient: Karen Wilcox  Procedure(s) Performed: Procedure(s): APPENDECTOMY LAPAROSCOPIC (N/A)  Patient Location: PACU  Anesthesia Type:General  Level of Consciousness: sedated  Airway & Oxygen Therapy: Patient Spontanous Breathing and Patient connected to face mask oxygen  Post-op Assessment: Report given to RN and Post -op Vital signs reviewed and stable  Post vital signs: Reviewed and stable  Last Vitals:  Vitals:   05/22/16 2144 05/22/16 2216  BP: 112/62 (!) 106/53  Pulse: 92 93  Resp: (!) 22 16  Temp:      Last Pain:  Vitals:   05/22/16 2112  TempSrc:   PainSc: 7          Complications: No apparent anesthesia complications

## 2016-05-22 NOTE — ED Notes (Signed)
Pt taken to US

## 2016-05-22 NOTE — ED Provider Notes (Signed)
[facility] Emergency Department Provider Note  ____________________________________________  Time seen: Approximately 7:45 PM  I have reviewed the triage vital signs and the nursing notes.   HISTORY  Chief Complaint Abdominal Pain   HPI Karen Wilcox is a 25 y.o. female no significant past medical history who presents for evaluation of right lower quadrant abdominal pain. Patient started having mild pain around 11 AM this morning. At 4:00 the pain became extremely severe, she is now complaining of 10 out of 10 pain, sharp, located in the right lower quadrant, radiating to her back. She denies ever having similar pain in her life. Patient does have a nexplanon for birth control. She has had 1 prior pregnancy with no complications. No prior abdominal surgeries. Patient has had nausea and multiple episodes of nonbloody nonbilious emesis. No chest pain, no shortness of breath, no diarrhea, no fever, no vaginal discharge.   History reviewed. No pertinent past medical history.  There are no active problems to display for this patient.   History reviewed. No pertinent surgical history.  Prior to Admission medications   Medication Sig Start Date End Date Taking? Authorizing Provider  cephALEXin (KEFLEX) 500 MG capsule Take 1 capsule (500 mg total) by mouth 4 (four) times daily. 07/26/15   Gayla DossEryka A Gayle, MD    Allergies Zantac [ranitidine hcl]  No family history on file.  Social History Social History  Substance Use Topics  . Smoking status: Never Smoker  . Smokeless tobacco: Never Used  . Alcohol use No    Review of Systems  Constitutional: Negative for fever. Eyes: Negative for visual changes. ENT: Negative for sore throat. Neck: No neck pain  Cardiovascular: Negative for chest pain. Respiratory: Negative for shortness of breath. Gastrointestinal: + RLQ abdominal pain, nausea, and vomiting. No diarrhea. Genitourinary: Negative for  dysuria. Musculoskeletal: Negative for back pain. Skin: Negative for rash. Neurological: Negative for headaches, weakness or numbness. Psych: No SI or HI  ____________________________________________   PHYSICAL EXAM:  VITAL SIGNS: ED Triage Vitals [05/22/16 1811]  Enc Vitals Group     BP (!) 122/57     Pulse Rate (!) 111     Resp 20     Temp 98.3 F (36.8 C)     Temp Source Oral     SpO2 100 %     Weight 105 lb (47.6 kg)     Height 5\' 4"  (1.626 m)     Head Circumference      Peak Flow      Pain Score 10     Pain Loc      Pain Edu?      Excl. in GC?     Constitutional: Alert and oriented. Well appearing and in no apparent distress. HEENT:      Head: Normocephalic and atraumatic.         Eyes: Conjunctivae are normal. Sclera is non-icteric. EOMI. PERRL      Mouth/Throat: Mucous membranes are moist.       Neck: Supple with no signs of meningismus. Cardiovascular: Tachycardic with regular rhythm. No murmurs, gallops, or rubs. 2+ symmetrical distal pulses are present in all extremities. No JVD. Respiratory: Normal respiratory effort. Lungs are clear to auscultation bilaterally. No wheezes, crackles, or rhonchi.  Gastrointestinal: Patient has tenderness to palpation diffusely, more severe in the right lower quadrant, with rebound and guarding  Genitourinary: No CVA tenderness. Musculoskeletal: Nontender with normal range of motion in all extremities. No edema, cyanosis, or erythema of extremities.  Neurologic: Normal speech and language. Face is symmetric. Moving all extremities. No gross focal neurologic deficits are appreciated. Skin: Skin is warm, dry and intact. No rash noted. Psychiatric: Mood and affect are normal. Speech and behavior are normal.  ____________________________________________   LABS (all labs ordered are listed, but only abnormal results are displayed)  Labs Reviewed  COMPREHENSIVE METABOLIC PANEL - Abnormal; Notable for the following:       Result  Value   Glucose, Bld 119 (*)    Total Protein 8.4 (*)    ALT 10 (*)    All other components within normal limits  CBC - Abnormal; Notable for the following:    WBC 18.0 (*)    RBC 5.28 (*)    MCV 71.2 (*)    MCH 22.7 (*)    MCHC 31.9 (*)    RDW 17.7 (*)    All other components within normal limits  URINALYSIS, COMPLETE (UACMP) WITH MICROSCOPIC - Abnormal; Notable for the following:    Color, Urine YELLOW (*)    APPearance CLEAR (*)    Hgb urine dipstick SMALL (*)    Ketones, ur 80 (*)    Leukocytes, UA SMALL (*)    Squamous Epithelial / LPF 0-5 (*)    All other components within normal limits  LIPASE, BLOOD  HCG, QUANTITATIVE, PREGNANCY  POCT PREGNANCY, URINE  POC URINE PREG, ED   ____________________________________________  EKG  ED ECG REPORT I, Nita Sickle, the attending physician, personally viewed and interpreted this ECG.  Sinus tachycardia, rate of 108, normal intervals, normal axis, no ST elevations or depressions. ____________________________________________  RADIOLOGY  CT a/p:    US pelvis: 1. Suspected early acute appendicitis, with a blind-ending tubular structure in the right lower quadrant incidentally seen, measuring up to 8 mm in thickness (normal upper limits of normal for the appendix is 7 mm). 2. Uterus, endometrium, and ovaries appear normal. No free pelvic fluid. ____________________________________________   PROCEDURES  Procedure(s) performed:Yes Procedures   Bedside FAST with no evidence of free fluid in the abdomen.   Critical Care performed: yes  CRITICAL CARE Performed by: Nita Sickle  ?  Total critical care time: 40 min  Critical care time was exclusive of separately billable procedures and treating other patients.  Critical care was necessary to treat or prevent imminent or life-threatening deterioration.  Critical care was time spent personally by me on the following activities: development of treatment plan  with patient and/or surrogate as well as nursing, discussions with consultants, evaluation of patient's response to treatment, examination of patient, obtaining history from patient or surrogate, ordering and performing treatments and interventions, ordering and review of laboratory studies, ordering and review of radiographic studies, pulse oximetry and re-evaluation of patient's condition.  ____________________________________________   INITIAL IMPRESSION / ASSESSMENT AND PLAN / ED COURSE  25 y.o. female no significant past medical history who presents for evaluation of severe right lower quadrant abdominal pain. Patient has evidence of surgical abdomen with diffuse tenderness worse in the right lower quadrant, rebound and guarding. She is tachycardic. Fast with no evidence of free fluid in the abdomen. Differential diagnosis including ectopic versus torsion versus ruptured appendicitis. Her white count is 18,000.  Clinical Course as of May 22 2133  Wynelle Link May 22, 2016  2037 I spoke with the ultrasound tech who says ovaries look normal with normal flow but she was worried that the appendix looked inflammed in the transvaginal US. I am worried for a surgical abdomen and therefore  I paged Dr. Tonita CongWoodham and asked him to come evaluate the patient in the ED befaore I pursue any further imaging. WIll give zosyn.  [CV]  2119 I spoke with the radiologist who confirmed appendicitis on transvaginal ultrasound. Patient was evaluated by Dr. Tonita CongWoodham who recommended a CT a/p. Patient back from CT. Radiology paged for STAT read.   [CV]    Clinical Course User Index [CV] Nita Sicklearolina Ruhi Kopke, MD    Pertinent labs & imaging results that were available during my care of the patient were reviewed by me and considered in my medical decision making (see chart for details).    ____________________________________________   FINAL CLINICAL IMPRESSION(S) / ED DIAGNOSES  Final diagnoses:  RLQ abdominal pain  Acute  appendicitis with localized peritonitis      NEW MEDICATIONS STARTED DURING THIS VISIT:  New Prescriptions   No medications on file     Note:  This document was prepared using Dragon voice recognition software and may include unintentional dictation errors.    Nita Sicklearolina Rishik Tubby, MD 05/22/16 2134

## 2016-05-23 ENCOUNTER — Encounter: Payer: Self-pay | Admitting: *Deleted

## 2016-05-23 LAB — BASIC METABOLIC PANEL
Anion gap: 6 (ref 5–15)
BUN: 7 mg/dL (ref 6–20)
CALCIUM: 8.9 mg/dL (ref 8.9–10.3)
CO2: 23 mmol/L (ref 22–32)
CREATININE: 0.41 mg/dL — AB (ref 0.44–1.00)
Chloride: 110 mmol/L (ref 101–111)
Glucose, Bld: 136 mg/dL — ABNORMAL HIGH (ref 65–99)
Potassium: 3.7 mmol/L (ref 3.5–5.1)
SODIUM: 139 mmol/L (ref 135–145)

## 2016-05-23 LAB — CBC
HCT: 31 % — ABNORMAL LOW (ref 35.0–47.0)
HEMOGLOBIN: 10.1 g/dL — AB (ref 12.0–16.0)
MCH: 23.3 pg — AB (ref 26.0–34.0)
MCHC: 32.6 g/dL (ref 32.0–36.0)
MCV: 71.6 fL — ABNORMAL LOW (ref 80.0–100.0)
PLATELETS: 248 10*3/uL (ref 150–440)
RBC: 4.33 MIL/uL (ref 3.80–5.20)
RDW: 18.1 % — AB (ref 11.5–14.5)
WBC: 15.1 10*3/uL — ABNORMAL HIGH (ref 3.6–11.0)

## 2016-05-23 MED ORDER — HYDROCODONE-ACETAMINOPHEN 5-325 MG PO TABS: 2.0000 | ORAL_TABLET | ORAL | 0 refills | Status: DC | PRN

## 2016-05-23 MED ORDER — ONDANSETRON HCL 4 MG/2ML IJ SOLN: 4.0000 mg | Freq: Four times a day (QID) | INTRAMUSCULAR | Status: DC | PRN

## 2016-05-23 MED ORDER — PIPERACILLIN-TAZOBACTAM 3.375 G IVPB
3.3750 g | Freq: Three times a day (TID) | INTRAVENOUS | Status: DC
  Administered 2016-05-23 (×2): 3.375 g via INTRAVENOUS
  Filled 2016-05-23 (×2): qty 50

## 2016-05-23 MED ORDER — DIPHENHYDRAMINE HCL 50 MG/ML IJ SOLN: 25.0000 mg | Freq: Four times a day (QID) | INTRAMUSCULAR | Status: DC | PRN

## 2016-05-23 MED ORDER — HYDRALAZINE HCL 20 MG/ML IJ SOLN: 10.0000 mg | INTRAMUSCULAR | Status: DC | PRN

## 2016-05-23 MED ORDER — LACTATED RINGERS IV SOLN
INTRAVENOUS | Status: DC
  Administered 2016-05-23 (×2): via INTRAVENOUS

## 2016-05-23 MED ORDER — HYDROCODONE-ACETAMINOPHEN 5-325 MG PO TABS: 1.0000 | ORAL_TABLET | ORAL | Status: DC | PRN

## 2016-05-23 MED ORDER — IBUPROFEN 800 MG PO TABS: 800.0000 mg | ORAL_TABLET | Freq: Three times a day (TID) | ORAL | 0 refills | Status: DC | PRN

## 2016-05-23 MED ORDER — ONDANSETRON 4 MG PO TBDP: 4.0000 mg | ORAL_TABLET | Freq: Four times a day (QID) | ORAL | Status: DC | PRN

## 2016-05-23 MED ORDER — MORPHINE SULFATE (PF) 4 MG/ML IV SOLN
4.0000 mg | INTRAVENOUS | Status: DC | PRN
  Administered 2016-05-23 (×2): 4 mg via INTRAVENOUS
  Filled 2016-05-23 (×2): qty 1

## 2016-05-23 MED ORDER — HYDROCODONE-ACETAMINOPHEN 5-325 MG PO TABS
2.0000 | ORAL_TABLET | ORAL | Status: DC | PRN
  Administered 2016-05-23: 2 via ORAL
  Filled 2016-05-23: qty 2

## 2016-05-23 MED ORDER — DIPHENHYDRAMINE HCL 25 MG PO CAPS: 25.0000 mg | ORAL_CAPSULE | Freq: Four times a day (QID) | ORAL | Status: DC | PRN

## 2016-05-23 NOTE — Discharge Summary (Signed)
Physician Discharge Summary  Patient ID: Oneida Alarlizabeth Fielding MRN: [id number] DOB/AGE: 1991/01/07 25 y.o.  Admit date: 05/22/2016 Discharge date: 05/23/2016  Admission Diagnoses:acute appendicitis  Discharge Diagnoses:  Active Problems:   Acute appendicitis with localized peritonitis   Appendicitis   Discharged Condition: good  Hospital Course: 25 yr old female with acute appendicitis, she underwent lap appy last night with Dr. Tonita CongWoodham.  She has been up and walking, her pain well controlled.  She has tolerated a regular diet.    Consults: None  Significant Diagnostic Studies: CT scan  Treatments: surgery: Lap appy Dr. Tonita CongWoodham  Discharge Exam: Blood pressure (!) 95/47, pulse 71, temperature 98.6 F (37 C), temperature source Oral, resp. rate 16, height 5\' 4"  (1.626 m), weight 118 lb 8 oz (53.8 kg), last menstrual period 05/15/2016, SpO2 98 %. General appearance: alert, cooperative and no distress GI: soft, incisions c/d/i, no drainage  Disposition: 01-Home or Self Care  Discharge Instructions    Call MD for:  difficulty breathing, headache or visual disturbances    Complete by:  As directed    Call MD for:  persistant nausea and vomiting    Complete by:  As directed    Call MD for:  redness, tenderness, or signs of infection (pain, swelling, redness, odor or green/yellow discharge around incision site)    Complete by:  As directed    Call MD for:  severe uncontrolled pain    Complete by:  As directed    Call MD for:  temperature >100.4    Complete by:  As directed    Diet general    Complete by:  As directed    Discharge wound care:    Complete by:  As directed    Remove dressing from incision sites at 48 hours after surgery   Driving Restrictions    Complete by:  As directed    No driving while on prescription pain medication   Increase activity slowly    Complete by:  As directed    Lifting restrictions    Complete by:  As directed    No lifting over 15lbs for 3  weeks   May shower / Bathe    Complete by:  As directed    May shower 48 hours after surgery       Medication List    TAKE these medications   HYDROcodone-acetaminophen 5-325 MG tablet Commonly known as:  NORCO/VICODIN Take 2 tablets by mouth every 4 (four) hours as needed for severe pain.   ibuprofen 800 MG tablet Commonly known as:  ADVIL,MOTRIN Take 1 tablet (800 mg total) by mouth every 8 (eight) hours as needed.      Follow-up Information    [facility]. Schedule an appointment as soon as possible for a visit.   Why:  f/u 1 week Lap Appy with Dr. Luna KitchensWoodham Contact information: [address] [place] [id number] [phone]          Signed: Gladis RiffleCatherine L Rigel Filsinger 05/23/2016, 1:07 PM

## 2016-05-23 NOTE — Progress Notes (Signed)
Pt to be discharged per MD order. IV removed. Instructions reviewed with pt and family. All questions answered. Scripts given to pt.

## 2016-05-24 LAB — SURGICAL PATHOLOGY

## 2016-05-24 NOTE — Anesthesia Postprocedure Evaluation (Signed)
Anesthesia Post Note  Patient: Oneida Alarlizabeth Crownover  Procedure(s) Performed: Procedure(s) (LRB): APPENDECTOMY LAPAROSCOPIC (N/A)  Patient location during evaluation: PACU Anesthesia Type: General Level of consciousness: awake and alert Pain management: pain level controlled Vital Signs Assessment: post-procedure vital signs reviewed and stable Respiratory status: spontaneous breathing, nonlabored ventilation, respiratory function stable and patient connected to nasal cannula oxygen Cardiovascular status: blood pressure returned to baseline and stable Postop Assessment: no signs of nausea or vomiting Anesthetic complications: no    Last Vitals:  Vitals:   05/23/16 0559 05/23/16 1231  BP: (!) 94/48 (!) 95/47  Pulse: 65 71  Resp: 20 16  Temp: 36.9 C 37 C    Last Pain:  Vitals:   05/23/16 1231  TempSrc: Oral  PainSc:                  Yevette EdwardsJames G Einar Nolasco

## 2016-05-25 ENCOUNTER — Encounter: Payer: Self-pay | Admitting: General Surgery

## 2016-05-26 ENCOUNTER — Other Ambulatory Visit: Payer: Self-pay

## 2016-05-30 ENCOUNTER — Ambulatory Visit (INDEPENDENT_AMBULATORY_CARE_PROVIDER_SITE_OTHER): Payer: Self-pay | Admitting: Surgery

## 2016-05-30 ENCOUNTER — Encounter: Payer: Self-pay | Admitting: Surgery

## 2016-05-30 VITALS — BP 114/67 | HR 77 | Temp 97.6°F | Ht 64.0 in | Wt 107.0 lb

## 2016-05-30 DIAGNOSIS — K353 Acute appendicitis with localized peritonitis, without perforation or gangrene: Secondary | ICD-10-CM

## 2016-05-30 DIAGNOSIS — Z9049 Acquired absence of other specified parts of digestive tract: Secondary | ICD-10-CM

## 2016-05-30 NOTE — Patient Instructions (Addendum)
Please call our office with any questions or concerns. 

## 2016-05-30 NOTE — Progress Notes (Signed)
05/30/2016  HPI: Patient is status post laparoscopic appendectomy with Dr. Tonita CongWoodham on 12/10. She presents today for follow-up appointment. She reports that she has been doing well with some minimal discomfort particularly over at the left lower quadrant incision. She denies having any nausea, fevers, chills, worsening abdominal pain.  Vital signs: BP 114/67   Pulse 77   Temp 97.6 F (36.4 C) (Oral)   Ht 5\' 4"  (1.626 m)   Wt 48.5 kg (107 lb)   LMP 05/15/2016 (Approximate) Comment: neg preg test  BMI 18.37 kg/m    Physical Exam: Constitutional: No acute distress Abdomen:  Soft, nondistended, with very mild tenderness to palpation over the left lower quadrant incision but otherwise all 3 incisions are clean dry and intact with no evidence of infection.  Assessment/Plan: 25 year old female status post laparoscopic appendectomy.  -I reassured the patient and her symptoms will continue to improve as the tissues heal well and the sutures dissolve. -Patient may follow-up on an as-needed basis. -Patient may return to work tomorrow but should continue no heavy lifting restriction of 10-15 pounds for a total of 4 weeks until 06/19/16   Karen Wilcox Karen Gwendloyn Forsee, MD [facility]

## 2016-06-02 ENCOUNTER — Telehealth: Payer: Self-pay

## 2016-06-02 NOTE — Telephone Encounter (Signed)
Patient's disability forms were filled out and faxed.

## 2016-06-03 ENCOUNTER — Other Ambulatory Visit: Payer: Self-pay

## 2016-06-03 DIAGNOSIS — K3589 Other acute appendicitis without perforation or gangrene: Secondary | ICD-10-CM

## 2016-06-03 NOTE — Progress Notes (Signed)
Patient called stating that she is not able to return to work with a 15 lbs restriction. However, she stated that she was able to go back with a 20 lbs restriction. She was told that we would ask Dr. Tonita CongWoodham if it's okay. She understood and had no further questions.  I told her that I would call her once her letter was done.  I spoke with Dr. Tonita CongWoodham and he agreed with me changing the weight restrictions to 20 lbs.   Patient was then contacted and was told that the weight restrictions on her disability certificate was changed to 20 lbs. Patient was good after this.

## 2018-10-03 IMAGING — CT CT ABD-PELV W/ CM
2 of 4 series · 14 of 46 positions shown, 16 images · IV contrast (APPLIED)
Comparison: Pelvic ultrasound dated 05/22/2016.

CLINICAL DATA: 25-year-old female with acute right lower quadrant
abdominal pain. Concern for acute appendicitis.

EXAM:
CT ABDOMEN AND PELVIS WITH CONTRAST
TECHNIQUE: Multidetector CT imaging of the abdomen and pelvis was performed
using the standard protocol following bolus administration of
intravenous contrast.
CONTRAST:  75mL Y1725X-JZZ IOPAMIDOL (Y1725X-JZZ) INJECTION 61%

[Series 2: routine abd/pel with · axial · 0.59mm/px · z∈[-426,-61]mm · 11 of 89 slices shown, 13 images]
[im 8/89  soft-tissue]
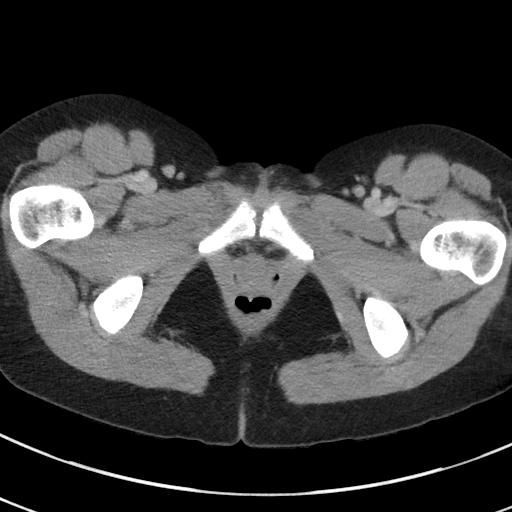
[im 8/89  bone]
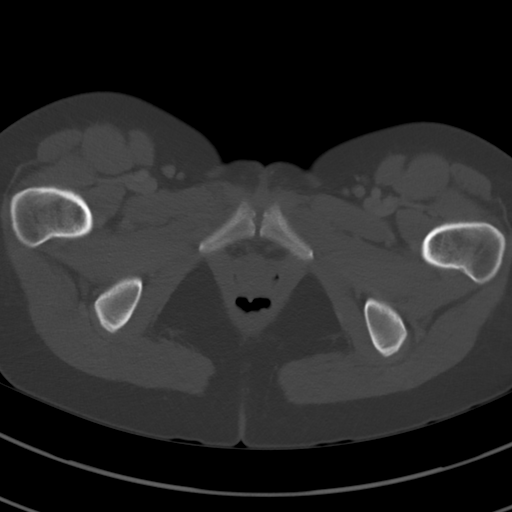
[im 15/89  soft-tissue]
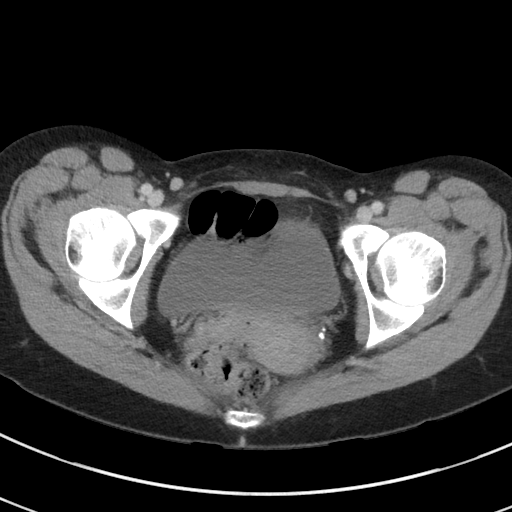
[im 22/89  soft-tissue]
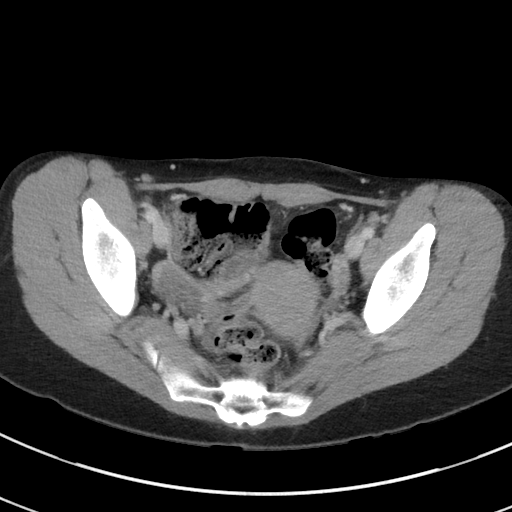
[im 29/89  soft-tissue]
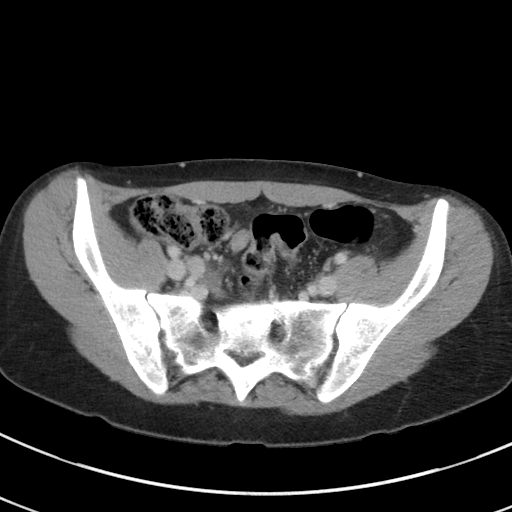
[im 36/89  soft-tissue]
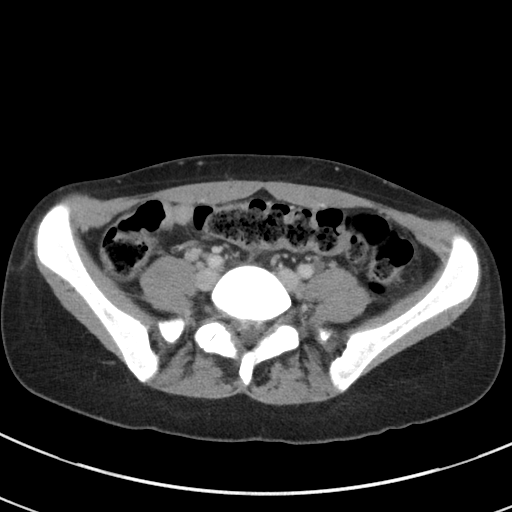
[im 46/89  soft-tissue]
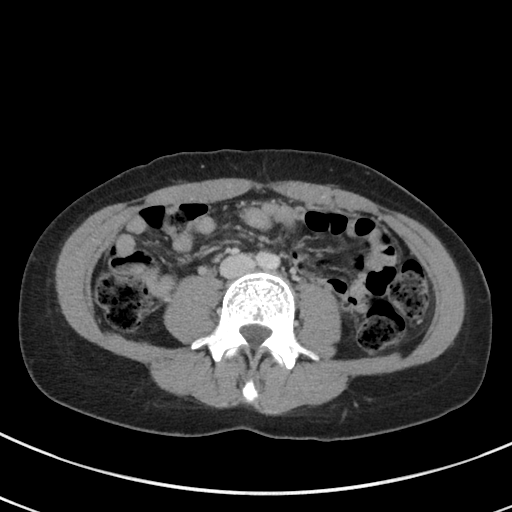
[im 53/89  soft-tissue]
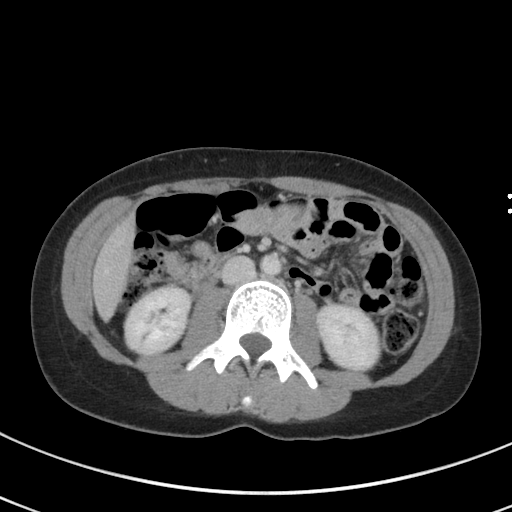
[im 60/89  soft-tissue]
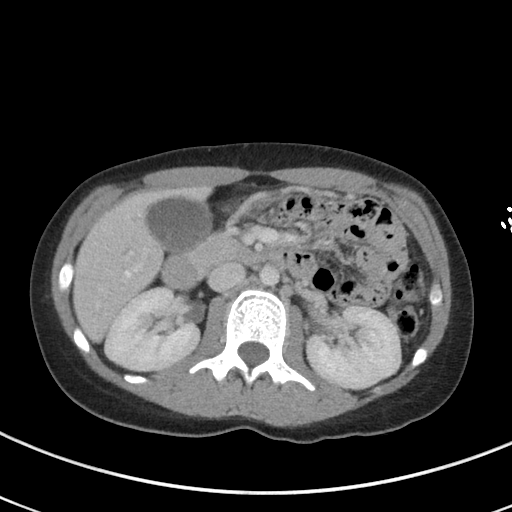
[im 67/89  soft-tissue]
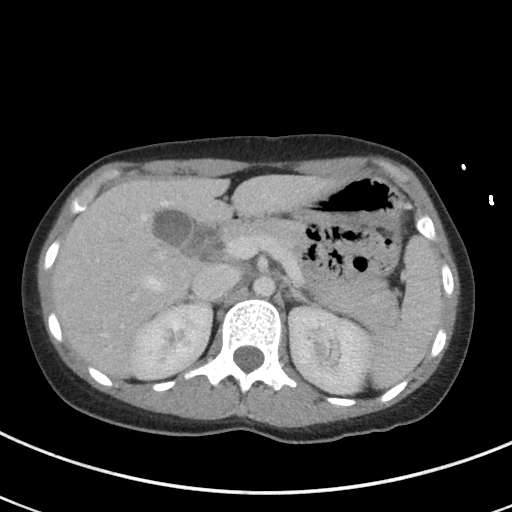
[im 67/89  bone]
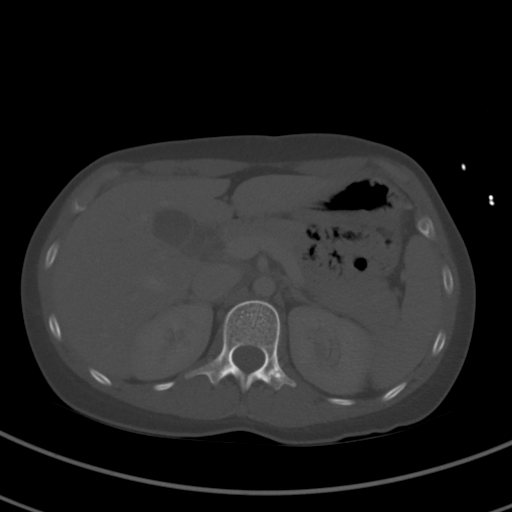
[im 74/89  soft-tissue]
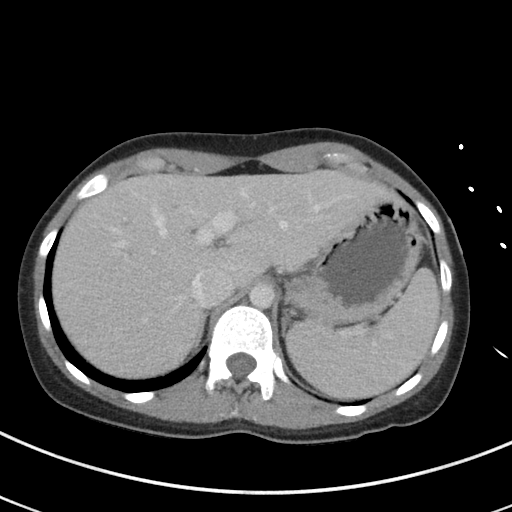
[im 81/89  soft-tissue]
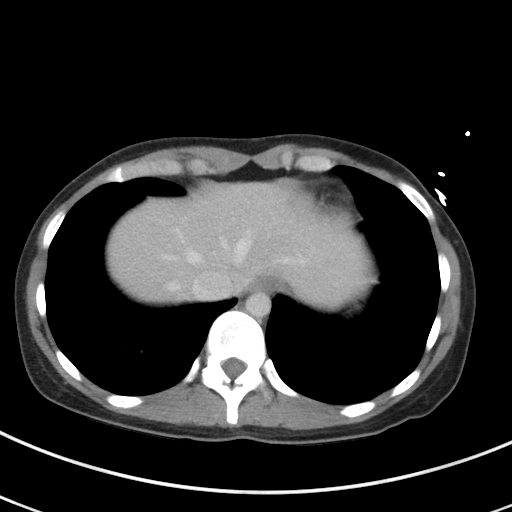

[Series 5: coronal st · coronal · 0.55mm/px · 3 of 61 slices shown]
[im 21/61  soft-tissue]
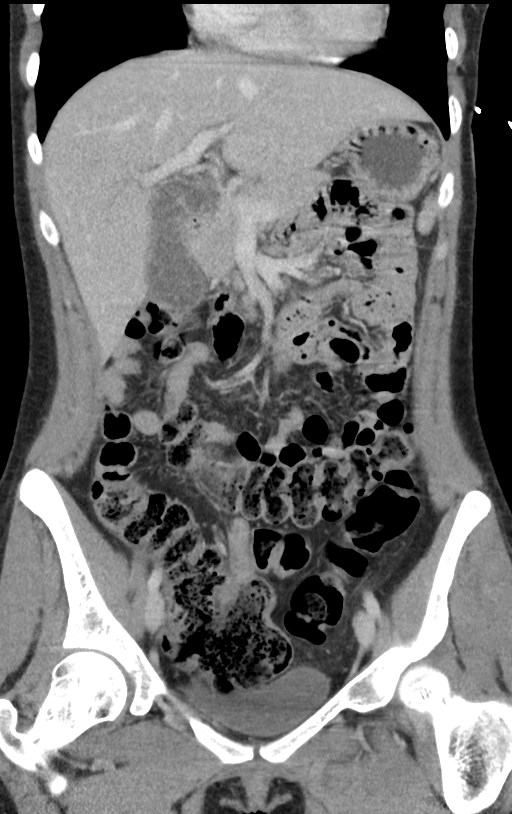
[im 27/61  soft-tissue]
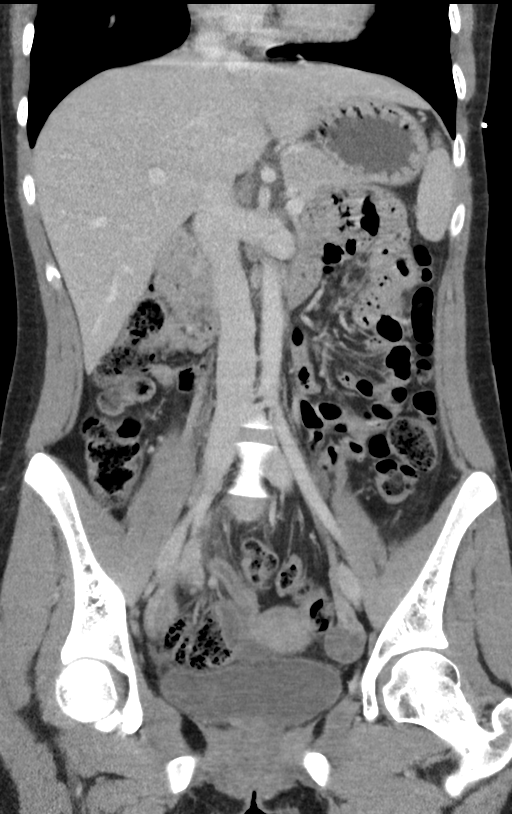
[im 34/61  soft-tissue]
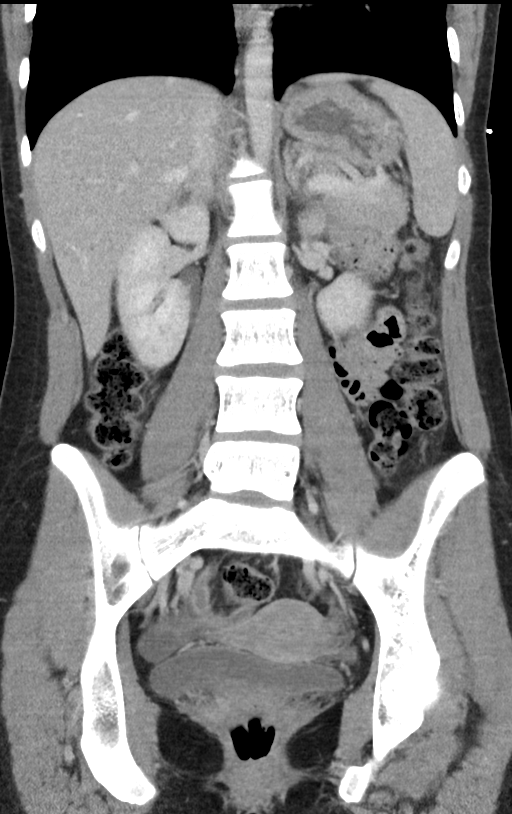

[14 of 46 positions shown; findings below may reference images not displayed]

FINDINGS: Lower chest: The visualized lung bases are clear. No intra-abdominal
free air. Trace free fluid may be present within the pelvis.

Hepatobiliary: The liver is unremarkable. No focal lesions or
abnormal enhancement. No intrahepatic biliary ductal dilatation.
There are probable multiple small noncalcified stones within the
gallbladder. No pericholecystic fluid or evidence of acute
inflammatory changes of the gallbladder by CT. Ultrasound may
provide better evaluation of the gallbladder if clinically
indicated. The common bile duct appears normal in diameter.

Pancreas: Unremarkable. No pancreatic ductal dilatation or
surrounding inflammatory changes.

Spleen: Normal in size without focal abnormality.

Adrenals/Urinary Tract: Adrenal glands are unremarkable. Kidneys are
normal, without renal calculi, focal lesion, or hydronephrosis.
Bladder is unremarkable.

Stomach/Bowel: Evaluation of the bowel is limited in the absence of
oral contrast. There is no evidence of bowel dilatation or
obstruction. Moderate stool noted throughout the colon. The appendix
is enlarged and inflamed. It is located in the right hemipelvis
posterior to the cecum extending posteriorly and to the right of the
uterus. There is no drainable fluid collection or abscess no
evidence of perforation.

Vascular/Lymphatic: No significant vascular findings are present. No
enlarged abdominal or pelvic lymph nodes.

Reproductive: The uterus is anteverted and grossly unremarkable.
Bilateral ovarian follicles noted. The ovaries are grossly
unremarkable.

Other: None

Musculoskeletal: No acute or significant osseous findings.
IMPRESSION: Acute appendicitis.  No abscess or evidence of perforation.

Small noncalcified gallstones without CT evidence of acute
cholecystitis.

These results were called by telephone at the time of interpretation
on 05/22/2016 at [DATE] to Dr. [HOSPITAL] LLAMAS , who verbally
acknowledged these results.

## 2019-04-14 ENCOUNTER — Other Ambulatory Visit: Payer: Self-pay | Admitting: Physician Assistant

## 2019-04-15 NOTE — Telephone Encounter (Signed)
Patient needs to RTC for in-person visit prior to more refills.

## 2019-07-01 ENCOUNTER — Other Ambulatory Visit: Payer: Self-pay | Admitting: Physician Assistant

## 2019-07-03 NOTE — Telephone Encounter (Signed)
Per chart in [facility], patient with last RP 03/2018.  CBE and pap due 2022.  Needs in-person visit prior to more refills.

## 2019-09-19 ENCOUNTER — Other Ambulatory Visit: Payer: Self-pay | Admitting: Physician Assistant

## 2019-09-20 ENCOUNTER — Telehealth: Payer: Self-pay | Admitting: Family Medicine

## 2019-09-20 ENCOUNTER — Other Ambulatory Visit: Payer: Self-pay | Admitting: Physician Assistant

## 2019-09-20 NOTE — Telephone Encounter (Signed)
refill birth control pills

## 2019-10-10 ENCOUNTER — Ambulatory Visit (LOCAL_COMMUNITY_HEALTH_CENTER): Payer: Medicaid Other | Admitting: Physician Assistant

## 2019-10-10 ENCOUNTER — Other Ambulatory Visit: Payer: Self-pay

## 2019-10-10 VITALS — BP 126/63 | Ht 63.4 in | Wt 138.2 lb

## 2019-10-10 DIAGNOSIS — Z3009 Encounter for other general counseling and advice on contraception: Secondary | ICD-10-CM

## 2019-10-10 MED ORDER — MULTIVITAMINS PO CAPS: 1.0000 | ORAL_CAPSULE | Freq: Every day | ORAL | 0 refills | Status: AC

## 2019-10-10 MED ORDER — NORGESTIMATE-ETH ESTRADIOL 0.25-35 MG-MCG PO TABS: 1.0000 | ORAL_TABLET | Freq: Every day | ORAL | 11 refills | Status: DC

## 2019-10-10 NOTE — Progress Notes (Addendum)
Here today for birth control refill. Ran out of Sprintec OCP's 10/04/2019. Last PE and Pap here was 04/10/2018. Declines all STD screening. MVI's given. Tawny Hopping, RN

## 2019-10-10 NOTE — Progress Notes (Signed)
   [facility]  [facility]  Subjective:  Karen Wilcox is a 29 y.o. being seen today for   Chief Complaint  Patient presents with  . Contraception    29 yo female G0 here requesting refill on OCPs. Last sex 09/11/19, LMP 10/07/19 (ongoing). Still not smoking, no h/o blood clots/stroke/MI/migraine in pt or FH. Likes OCPs and wants to continue. She and her partner are considering a pregnancy after 6 mo or so. Feels well, no vaginal discharge, odor, itch or menstrual problems.    Does the patient have a current or past history of drug use? No   No components found for: HCV]   Health Maintenance Due  Topic Date Due  . HIV Screening  Never done  . TETANUS/TDAP  Never done  . PAP-Cervical Cytology Screening  Never done  . PAP SMEAR-Modifier  Never done    Review of Systems  Constitutional: Negative.   HENT: Negative.   Eyes: Negative.   Respiratory: Negative.   Cardiovascular: Negative.   Genitourinary: Negative.   Musculoskeletal: Negative.   Skin: Negative.   Neurological: Negative.   Endo/Heme/Allergies: Negative.     The following portions of the patient's history were reviewed and updated as appropriate: allergies, current medications, past family history, past medical history, past social history, past surgical history and problem list. Problem list updated.   See flowsheet for other program required questions.  Objective:   Vitals:   10/10/19 0929  BP: 126/63  Weight: 138 lb 3.2 oz (62.7 kg)  Height: 5' 3.4" (1.61 m)    Physical Exam Vitals and nursing note reviewed.  Constitutional:      Appearance: Normal appearance. She is normal weight.  Pulmonary:     Effort: Pulmonary effort is normal.  Musculoskeletal:     Cervical back: Normal range of motion.  Skin:    General: Skin is warm and dry.  Neurological:     Mental Status: She is alert and oriented to person, place, and time.  Psychiatric:         Behavior: Behavior normal.        Thought Content: Thought content normal.        Judgment: Judgment normal.       Assessment and Plan:  [name] is a 29 y.o. female presenting to the [facility] Department for a Women's Health problem visit  1. Family planning services Continue Sprintec, eRx to pharmacy of choice. Continue healthy habits, take MVI with folic acid daily and if decide to pursue pregnancy, discontinue OCPs and get pregnancy test if missed menses. Next Pap due 03/2021. - Multiple Vitamin (MULTIVITAMIN) capsule; Take 1 capsule by mouth daily for 100 doses.  Dispense: 100 capsule; Refill: 0 - norgestimate-ethinyl estradiol (ORTHO-CYCLEN) 0.25-35 MG-MCG tablet; Take 1 tablet by mouth daily.  Dispense: 1 Package; Refill: 11     No follow-ups on file.  No future appointments.  Landry Dyke, PA-C

## 2021-04-07 ENCOUNTER — Ambulatory Visit (LOCAL_COMMUNITY_HEALTH_CENTER): Payer: Self-pay

## 2021-04-07 ENCOUNTER — Other Ambulatory Visit: Payer: Self-pay

## 2021-04-07 VITALS — BP 124/76 | Ht 63.0 in | Wt 141.0 lb

## 2021-04-07 DIAGNOSIS — Z3201 Encounter for pregnancy test, result positive: Secondary | ICD-10-CM

## 2021-04-07 LAB — PREGNANCY, URINE: Preg Test, Ur: POSITIVE — AB

## 2021-04-07 MED ORDER — PRENATAL VITAMIN 27-0.8 MG PO TABS: 1.0000 | ORAL_TABLET | ORAL | 0 refills | Status: AC

## 2021-04-07 NOTE — Progress Notes (Signed)
Patient desires her prenatal care at [facility]. Hart Carwin, RN

## 2021-04-28 ENCOUNTER — Ambulatory Visit (INDEPENDENT_AMBULATORY_CARE_PROVIDER_SITE_OTHER): Payer: 59 | Admitting: Advanced Practice Midwife

## 2021-04-28 ENCOUNTER — Other Ambulatory Visit: Payer: Self-pay

## 2021-04-28 ENCOUNTER — Other Ambulatory Visit (HOSPITAL_COMMUNITY)
Admission: RE | Admit: 2021-04-28 | Discharge: 2021-04-28 | Disposition: A | Discharge status: Home / Self Care | Payer: Self-pay | Source: Ambulatory Visit | Attending: Advanced Practice Midwife | Admitting: Advanced Practice Midwife

## 2021-04-28 ENCOUNTER — Encounter: Payer: Self-pay | Admitting: Advanced Practice Midwife

## 2021-04-28 VITALS — BP 131/85 | HR 116 | Wt 142.0 lb

## 2021-04-28 DIAGNOSIS — R000 Tachycardia, unspecified: Secondary | ICD-10-CM

## 2021-04-28 DIAGNOSIS — Z3490 Encounter for supervision of normal pregnancy, unspecified, unspecified trimester: Secondary | ICD-10-CM | POA: Insufficient documentation

## 2021-04-28 DIAGNOSIS — Z369 Encounter for antenatal screening, unspecified: Secondary | ICD-10-CM

## 2021-04-28 DIAGNOSIS — Z124 Encounter for screening for malignant neoplasm of cervix: Secondary | ICD-10-CM | POA: Insufficient documentation

## 2021-04-28 DIAGNOSIS — Z113 Encounter for screening for infections with a predominantly sexual mode of transmission: Secondary | ICD-10-CM | POA: Insufficient documentation

## 2021-04-28 DIAGNOSIS — Z3482 Encounter for supervision of other normal pregnancy, second trimester: Secondary | ICD-10-CM | POA: Insufficient documentation

## 2021-04-28 DIAGNOSIS — Z1159 Encounter for screening for other viral diseases: Secondary | ICD-10-CM

## 2021-04-28 DIAGNOSIS — Z3480 Encounter for supervision of other normal pregnancy, unspecified trimester: Secondary | ICD-10-CM | POA: Insufficient documentation

## 2021-04-28 DIAGNOSIS — Z3A13 13 weeks gestation of pregnancy: Secondary | ICD-10-CM

## 2021-04-28 NOTE — Patient Instructions (Signed)

## 2021-05-03 ENCOUNTER — Encounter: Payer: Self-pay | Admitting: Advanced Practice Midwife

## 2021-05-03 LAB — URINE CULTURE

## 2021-05-03 NOTE — Progress Notes (Signed)
New Obstetric Patient H&P  Date of Service: 04/28/2021  Chief Complaint: "Desires prenatal care"   History of Present Illness: Patient is a 30 y.o. G2P1001 Not Hispanic or Latino female, presents with amenorrhea and positive home pregnancy test. Patient's last menstrual period was 01/25/2021 (exact date). and based on her  LMP, her EDD is Estimated Date of Delivery: 11/01/21 and her EGA is [redacted]w[redacted]d. Cycles are 5 days, regular, and occur approximately every : 28 days. Her last pap smear was 6 years ago and was no abnormalities per patient report.    She had a urine pregnancy test which was positive 2 month(s)  ago. Her last menstrual period was normal and lasted for  5 day(s). Since her LMP she claims she has experienced breast tenderness, fatigue, nausea. She denies vaginal bleeding. Her past medical history is noncontributory. Her prior pregnancies are notable for  FT SVD 7#9oz female 2016- given up for adoption.  Since her LMP, she admits to the use of tobacco products  no She claims she has gained  1  pounds since the start of her pregnancy.  There are cats in the home in the home  no  She admits close contact with children on a regular basis  no  She has had chicken pox in the past yes She has had Tuberculosis exposures, symptoms, or previously tested positive for TB   no Current or past history of domestic violence. Past history  Genetic Screening/Teratology Counseling: (Includes patient, baby's father, or anyone in either family with:)   1. Patient's age >/= 55 at [facility]  no 2. Thalassemia ([place], [place], Mediterranean, or Asian background): MCV<80  no 3. Neural tube defect (meningomyelocele, spina bifida, anencephaly)  no 4. Congenital heart defect  no  5. Down syndrome  no 6. Tay-Sachs (Jewish, [place]  no 7. Canavan's Disease  no 8. Sickle cell disease or trait (African)  no  9. Hemophilia or other blood disorders  no  10. Muscular dystrophy  no  11. Cystic fibrosis  no   12. Huntington's Chorea  no  13. Mental retardation/autism  no 14. Other inherited genetic or chromosomal disorder  no 15. Maternal metabolic disorder (DM, PKU, etc)  no 16. Patient or FOB with a child with a birth defect not listed above no  16a. Patient or FOB with a birth defect themselves no 17. Recurrent pregnancy loss, or stillbirth  no  18. Any medications since LMP other than prenatal vitamins (include vitamins, supplements, OTC meds, drugs, alcohol)  no 19. Any other genetic/environmental exposure to discuss  no  Infection History:   1. Lives with someone with TB or TB exposed  no  2. Patient or partner has history of genital herpes  no 3. Rash or viral illness since LMP  no 4. History of STI (GC, CT, HPV, syphilis, HIV)  no 5. History of recent travel :  no  Other pertinent information:  Had negative work up for chest pain 2 months ago. More recently she has heart racing and feels she is "on the verge of passing out".   Review of Systems:10 point review of systems negative unless otherwise noted in HPI  Past Medical History:  Patient Active Problem List   Diagnosis Date Noted   Supervision of normal pregnancy 04/28/2021     Nursing Staff Provider  Office Location  [facility] Dating    Language  English Anatomy US    Flu Vaccine   Genetic Screen  NIPS:   TDaP  vaccine    Hgb A1C or  GTT Early : Third trimester :   Covid    LAB RESULTS   Rhogam   Blood Type     Feeding Plan Breast Antibody    Contraception  Rubella    Circumcision  RPR     Pediatrician   HBsAg     Support Person Weston Brass HIV    Prenatal Classes  Varicella     GBS  (For PCN allergy, check sensitivities)   BTL Consent     VBAC Consent NA Pap  04/28/21    Hgb Electro    Pelvis Tested 7#9oz CF      SMA         G1 given up for adoption Heart racing: Ambulatory Referral to Cardiology    Past Surgical History:  Past Surgical History:  Procedure Laterality Date   APPENDECTOMY     APPENDECTOMY      5 years ago?   LAPAROSCOPIC APPENDECTOMY N/A 05/22/2016   Procedure: APPENDECTOMY LAPAROSCOPIC;  Surgeon: Ricarda Frame, MD;  Location: [facility] ORS;  Service: General;  Laterality: N/A;    Gynecologic History: Patient's last menstrual period was 01/25/2021 (exact date).  Obstetric History: G2P1001  Family History:  History reviewed. No pertinent family history.  Social History:  Social History   Socioeconomic History   Marital status: Married    Spouse name: Berton Bon   Number of children: Not on file   Years of education: Not on file   Highest education level: Not on file  Occupational History   Not on file  Tobacco Use   Smoking status: Never   Smokeless tobacco: Never  Substance and Sexual Activity   Alcohol use: No   Drug use: No   Sexual activity: Yes    Birth control/protection: Pill    Comment: Stopped OCP's 06/2019  Other Topics Concern   Not on file  Social History Narrative   Not on file   Social Determinants of Health   Financial Resource Strain: Not on file  Food Insecurity: Not on file  Transportation Needs: Not on file  Physical Activity: Not on file  Stress: Not on file  Social Connections: Not on file  Intimate Partner Violence: Not At Risk   Fear of Current or Ex-Partner: No   Emotionally Abused: No   Physically Abused: No   Sexually Abused: No    Allergies:  Allergies  Allergen Reactions   Zantac [Ranitidine Hcl] Hives    Medications: Prior to Admission medications   Not on File    Physical Exam Vitals: Blood pressure 131/85, pulse (!) 116, weight 142 lb (64.4 kg), last menstrual period 01/25/2021.  General: NAD HEENT: normocephalic, anicteric Thyroid: no enlargement, no palpable nodules Pulmonary: No increased work of breathing, CTAB Cardiovascular: RRR, distal pulses 2+ Abdomen: NABS, soft, non-tender, non-distended.  Umbilicus without lesions.  No hepatomegaly, splenomegaly or masses palpable. No evidence of hernia.  FHTs 160s  Genitourinary:  External: Normal external female genitalia.  Normal urethral meatus, normal  Bartholin's and Skene's glands.    Vagina: Normal vaginal mucosa, no evidence of prolapse.    Cervix: Grossly normal in appearance, no bleeding, no CMT Extremities: no edema, erythema, or tenderness Neurologic: Grossly intact Psychiatric: mood appropriate, affect full   The following were addressed during this visit:  Breastfeeding Education - Early initiation of breastfeeding    Comments: Keeps milk supply adequate, helps contract uterus and slow bleeding, and early milk is the perfect first food and is  easy to digest.   - The importance of exclusive breastfeeding    Comments: Provides antibodies, Lower risk of breast and ovarian cancers, and type-2 diabetes,Helps your body recover, Reduced chance of SIDS.   - Risks of giving your baby anything other than breast milk if you are breastfeeding    Comments: Make the baby less content with breastfeeds, may make my baby more susceptible to illness, and may reduce my milk supply.   - The importance of early skin-to-skin contact    Comments:  Keeps baby warm and secure, helps keep baby's blood sugar up and breathing steady, easier to bond and breastfeed, and helps calm baby.  - Rooming-in on a 24-hour basis    Comments: Easier to learn baby's feeding cues, easier to bond and get to know each other, and encourages milk production.   - Feeding on demand or baby-led feeding    Comments: Helps prevent breastfeeding complications, helps bring in good milk supply, prevents under or overfeeding, and helps baby feel content and satisfied   - Frequent feeding to help assure optimal milk production    Comments: Making a full supply of milk requires frequent removal of milk from breasts, infant will eat 8-12 times in 24 hours, if separated from infant use breast massage, hand expression and/ or pumping to remove milk from breasts.   -  Effective positioning and attachment    Comments: Helps my baby to get enough breast milk, helps to produce an adequate milk supply, and helps prevent nipple pain and damage   - Exclusive breastfeeding for the first 6 months    Comments: Builds a healthy milk supply and keeps it up, protects baby from sickness and disease, and breastmilk has everything your baby needs for the first 6 months.   Assessment: 30 y.o. G2P1001 at [redacted]w[redacted]d by LMP presenting to initiate prenatal care  Plan: 1) Avoid alcoholic beverages. 2) Patient encouraged not to smoke.  3) Discontinue the use of all non-medicinal drugs and chemicals.  4) Take prenatal vitamins daily.  5) Nutrition, food safety (fish, cheese advisories, and high nitrite foods) and exercise discussed. 6) Hospital and practice style discussed with cross coverage system.  7) Genetic Screening, such as with 1st Trimester Screening, cell free fetal DNA, AFP testing, and Ultrasound, as well as with amniocentesis and CVS as appropriate, is discussed with patient. At the conclusion of today's visit patient requested genetic testing 8) Patient is asked about travel to areas at risk for the Zika virus, and counseled to avoid travel and exposure to mosquitoes or sexual partners who may have themselves been exposed to the virus. Testing is discussed, and will be ordered as appropriate.  9) Urine culture, PAPtima, NOB panel, Hep C screen, MaterniT 21 today 10) Heart racing: ambulatory referral to Cardiology 11) Return to clinic in 1 week for dating and ROB   Tresea Mall, CNM [facility] 05/03/2021, 2:13 PM

## 2021-05-04 LAB — RPR+RH+ABO+RUB AB+AB SCR+CB...
Antibody Screen: NEGATIVE
HIV Screen 4th Generation wRfx: NONREACTIVE
Hematocrit: 38.5 % (ref 34.0–46.6)
Hemoglobin: 13 g/dL (ref 11.1–15.9)
Hepatitis B Surface Ag: NEGATIVE
MCH: 29.4 pg (ref 26.6–33.0)
MCHC: 33.8 g/dL (ref 31.5–35.7)
MCV: 87 fL (ref 79–97)
Platelets: 323 10*3/uL (ref 150–450)
RBC: 4.42 x10E6/uL (ref 3.77–5.28)
RDW: 12.3 % (ref 11.7–15.4)
RPR Ser Ql: NONREACTIVE
Rh Factor: POSITIVE
Rubella Antibodies, IGG: 1.49 index (ref >0.99)
Varicella zoster IgG: 491 index (ref >165)
WBC: 11.1 10*3/uL — ABNORMAL HIGH (ref 3.4–10.8)

## 2021-05-04 LAB — MATERNIT21 PLUS CORE+SCA
Fetal Fraction: 8
Monosomy X (Turner Syndrome): NOT DETECTED
Result (T21): NEGATIVE
Trisomy 13 (Patau syndrome): NEGATIVE
Trisomy 18 (Edwards syndrome): NEGATIVE
Trisomy 21 (Down syndrome): NEGATIVE
XXX (Triple X Syndrome): NOT DETECTED
XXY (Klinefelter Syndrome): NOT DETECTED
XYY (Jacobs Syndrome): NOT DETECTED

## 2021-05-04 LAB — HEPATITIS C ANTIBODY: Hep C Virus Ab: <0.1 s/co ratio (ref 0.0–0.9)

## 2021-05-10 LAB — CYTOLOGY - PAP
Chlamydia: NEGATIVE
Comment: NEGATIVE
Comment: NEGATIVE
Comment: NEGATIVE
Comment: NEGATIVE
Comment: NORMAL
Diagnosis: NEGATIVE
HSV1: NEGATIVE
HSV2: NEGATIVE
High risk HPV: NEGATIVE
Neisseria Gonorrhea: NEGATIVE
Trichomonas: NEGATIVE

## 2021-05-14 ENCOUNTER — Ambulatory Visit: Payer: 59

## 2021-05-14 ENCOUNTER — Encounter: Payer: 59 | Admitting: Obstetrics and Gynecology

## 2021-05-14 DIAGNOSIS — Z3482 Encounter for supervision of other normal pregnancy, second trimester: Secondary | ICD-10-CM

## 2021-05-14 DIAGNOSIS — Z369 Encounter for antenatal screening, unspecified: Secondary | ICD-10-CM

## 2021-05-25 ENCOUNTER — Encounter: Payer: Self-pay | Admitting: Advanced Practice Midwife

## 2021-05-27 ENCOUNTER — Other Ambulatory Visit: Payer: Self-pay | Admitting: Advanced Practice Midwife

## 2021-05-27 DIAGNOSIS — Z369 Encounter for antenatal screening, unspecified: Secondary | ICD-10-CM

## 2021-05-27 DIAGNOSIS — Z3482 Encounter for supervision of other normal pregnancy, second trimester: Secondary | ICD-10-CM

## 2021-05-27 NOTE — Progress Notes (Signed)
U/S +14w order placed for anatomy scan.

## 2021-05-28 ENCOUNTER — Encounter: Payer: 59 | Admitting: Obstetrics

## 2021-05-28 ENCOUNTER — Ambulatory Visit: Payer: 59

## 2021-05-28 ENCOUNTER — Ambulatory Visit: Admission: RE | Admit: 2021-05-28 | Payer: 59 | Source: Ambulatory Visit

## 2021-06-01 ENCOUNTER — Encounter: Payer: 59 | Admitting: Obstetrics

## 2021-06-02 ENCOUNTER — Ambulatory Visit
Admission: RE | Admit: 2021-06-02 | Discharge: 2021-06-02 | Disposition: A | Discharge status: Home / Self Care | Payer: 59 | Source: Ambulatory Visit | Attending: Advanced Practice Midwife | Admitting: Advanced Practice Midwife

## 2021-06-02 ENCOUNTER — Other Ambulatory Visit: Payer: Self-pay

## 2021-06-02 DIAGNOSIS — Z3482 Encounter for supervision of other normal pregnancy, second trimester: Secondary | ICD-10-CM

## 2021-06-02 DIAGNOSIS — Z369 Encounter for antenatal screening, unspecified: Secondary | ICD-10-CM

## 2021-06-03 ENCOUNTER — Encounter: Payer: Self-pay | Admitting: Licensed Practical Nurse

## 2021-06-03 ENCOUNTER — Ambulatory Visit (INDEPENDENT_AMBULATORY_CARE_PROVIDER_SITE_OTHER): Payer: 59 | Admitting: Licensed Practical Nurse

## 2021-06-03 VITALS — BP 118/76 | Wt 143.0 lb

## 2021-06-03 DIAGNOSIS — Z3A18 18 weeks gestation of pregnancy: Secondary | ICD-10-CM

## 2021-06-03 DIAGNOSIS — Z3482 Encounter for supervision of other normal pregnancy, second trimester: Secondary | ICD-10-CM

## 2021-06-03 LAB — POCT URINALYSIS DIPSTICK OB
Glucose, UA: NEGATIVE
POC,PROTEIN,UA: NEGATIVE

## 2021-06-03 NOTE — Progress Notes (Signed)
ROB - no concerns. RM 4 °

## 2021-06-03 NOTE — Progress Notes (Signed)
Routine Prenatal Care Visit  Subjective  Karen Wilcox is a 30 y.o. G2P1001 at [redacted]w[redacted]d being seen today for ongoing prenatal care.  She is currently monitored for the following issues for this low-risk pregnancy and has Supervision of normal pregnancy on their problem list.  ----------------------------------------------------------------------------------- Patient reports feels great, nausea has improved feels like she is able to eat. Gets occasional headaches-this is not new for her, encouraged increasing hydration, Tylenol with a can of coke as needed. Desires to transfer to [facility] as she desires a Systems developer. Cardiology referral has been made but symptoms have stopped so she may not fu with cardiology.   .  .   Pincus Large Fluid denies.  ----------------------------------------------------------------------------------- The following portions of the patient's history were reviewed and updated as appropriate: allergies, current medications, past family history, past medical history, past social history, past surgical history and problem list. Problem list updated.  Objective  Blood pressure 118/76, weight 143 lb (64.9 kg), last menstrual period 01/25/2021. Pregravid weight 141 lb (64 kg) Total Weight Gain 2 lb (0.907 kg) Urinalysis: Urine Protein Negative  Urine Glucose Negative  Fetal Status:         active   General:  Alert, oriented and cooperative. Patient is in no acute distress.  Skin: Skin is warm and dry. No rash noted.   Cardiovascular: Normal heart rate noted  Respiratory: Normal respiratory effort, no problems with respiration noted  Abdomen: Soft, gravid, appropriate for gestational age.       Pelvic:  Cervical exam deferred        Extremities: Normal range of motion.     Mental Status: Normal mood and affect. Normal behavior. Normal judgment and thought content.   Assessment   30 y.o. G2P1001 at [redacted]w[redacted]d by  11/01/2021, by Last Menstrual Period presenting for routine  prenatal visit  Plan   pregnancy 2 Problems (from 04/28/21 to present)     Problem Noted Resolved   Supervision of normal pregnancy 04/28/2021 by Tresea Mall, CNM No   Overview Addendum 05/03/2021  2:27 PM by Tresea Mall, CNM     Nursing Staff Provider  Office Location  [facility] Dating  LMP   Language  English Anatomy US  Normal   Flu Vaccine  UP to date  Genetic Screen  NIPS:   TDaP vaccine    Hgb A1C or  GTT Early : Third trimester :   Covid Up to date   LAB RESULTS   Rhogam   Blood Type     Feeding Plan Breast Antibody    Contraception  Rubella    Circumcision  RPR     Pediatrician   HBsAg     Support Person Karen Wilcox HIV    Prenatal Classes  Varicella     GBS  (For PCN allergy, check sensitivities)   BTL Consent     VBAC Consent NA Pap  04/28/21    Hgb Electro    Pelvis Tested 7#9oz CF      SMA         G1 given up for adoption Heart racing: ambulatory referral to cardiology            Preterm labor symptoms and general obstetric precautions including but not limited to vaginal bleeding, contractions, leaking of fluid and fetal movement were reviewed in detail with the patient. Please refer to After Visit Summary for other counseling recommendations.   -Desires to transfer to [facility] for waterbirth   RTC in 4 weeks  Carie Caddy, CNM  Domingo Pulse, [facility]  06/03/21  1:48 PM

## 2021-06-04 ENCOUNTER — Other Ambulatory Visit: Payer: Self-pay | Admitting: Licensed Practical Nurse

## 2021-06-04 ENCOUNTER — Encounter: Payer: Self-pay | Admitting: Licensed Practical Nurse

## 2021-06-04 DIAGNOSIS — Z3400 Encounter for supervision of normal first pregnancy, unspecified trimester: Secondary | ICD-10-CM

## 2021-06-04 NOTE — Progress Notes (Signed)
[facility]. Referral made

## 2021-06-11 ENCOUNTER — Encounter: Payer: Self-pay | Admitting: Licensed Practical Nurse

## 2021-06-11 ENCOUNTER — Other Ambulatory Visit: Payer: Self-pay | Admitting: Obstetrics and Gynecology

## 2021-06-11 NOTE — Progress Notes (Signed)
Called patient to discussed bleeding and covid. She has a home doppler and checked the baby heart rate which she reported was normal. Bleeding was bright red yesterday but today has been very small amounts of bleeding. She is feeling normal fetal movements. She reports head congestion with covid but also thinks that that is improving. Recommended considering Paxlovid. Encouraged to come to the hospital if she has more bleeding.   Adelene Idler MD, [facility], [facility] 06/11/2021 7:21 PM

## 2021-06-13 NOTE — L&D Delivery Note (Addendum)
Vaginal Delivery Note ? ?Pre-procedure Diagnosis: SIUP @ [redacted]w[redacted]d ?Indications: 31 y.o. G2P1001 Estimated Date of Delivery: 11/01/21 here today for PROM and early labor. Her pregnancy has been uncomplicated. Her labor course was uncomplicated.   ?Post-procedure Diagnosis: SIUP @ [redacted]w[redacted]d ; same ? ?Provider: Ardyth Harps, SNM; Camelia Eng CNM ? ?Anesthesia: epidural ? ?Complications: none ? ?Delivery Estimated Blood Loss (EBL):  40 mL ? ?Transfusions: none ? ?Pathology: placenta to l&d routinely ? ?Labor Events: ?Rupture date: 10/11/2021 , at 12:30 PM .  ?Rupture type: Spontaneous [1]  ?Fluid characteristic: Clear [1]  ?Interval from ROM to Delivery: 27h 11m ?Induction: None [1]  ?Augmentation: Other [5];Pitocin [6]  ?Sex: M ? ?Delivery Information for baby boy of Aunisty Reali ?Time of Birth: 4:13 PM  ?Baby Weight:  pending ? ?APGARS One minute Five minutes ?Totals: 9  9   ?Newborn is AGA and well. Patient plans to breastfeed.   ? ?Procedure:  ?Called to bs for constant maternal pressure. The patient was placed in lithotomy position, draped under in a routine fashion.   ?SVD of a viable female infant in cephalic presentation delivered spontaneously over a supported perineum. Nuchal cord x's 1, delivered through.No dystocia. No meconium present. Nose and mouth suctioned with bulb; cord clamped and cut. The placenta was then delivered spontaneously and intact via Tomasa Blase; [facility]. The uterine fundus was firm after uterine massage and with a 300 ml bolus of 30 units of pitocin. Inspection revealed  a hemostatic 1st degree laceration not requiring repair.  Patient tolerated the delivery well. Instrument, sponge, and needle counts were correct at the end of the procedure. ? ? ?Disposition: stable to transfer to [facility] ? ? ?Electronically Signed By: ?Ardyth Harps, SNM ?10/12/21 4:44 PM   ? ? ?Attestation of CNM Supervision of Midwife Student: Evaluation and management procedures were performed by the midwife student under my  supervision. I was present, gloved, and immediately available for direct supervision, assistance and direction throughout this encounter. I also confirm that I have verified the information documented in the resident?s note, and that I have also personally reperformed the pertinent components of the physical exam and all of the medical decision making activities.  I have also made any necessary editorial changes. ? ?Brand Males, CNM ?10/12/2021 5:38 PM ? ? ?

## 2021-06-28 ENCOUNTER — Encounter: Payer: Self-pay | Admitting: Licensed Practical Nurse

## 2021-06-28 ENCOUNTER — Ambulatory Visit (INDEPENDENT_AMBULATORY_CARE_PROVIDER_SITE_OTHER): Payer: 59 | Admitting: Licensed Practical Nurse

## 2021-06-28 ENCOUNTER — Other Ambulatory Visit: Payer: Self-pay

## 2021-06-28 VITALS — BP 108/74 | Wt 147.0 lb

## 2021-06-28 DIAGNOSIS — Z3A22 22 weeks gestation of pregnancy: Secondary | ICD-10-CM

## 2021-06-28 DIAGNOSIS — Z3480 Encounter for supervision of other normal pregnancy, unspecified trimester: Secondary | ICD-10-CM

## 2021-06-28 LAB — POCT URINALYSIS DIPSTICK OB
Glucose, UA: NEGATIVE
POC,PROTEIN,UA: NEGATIVE

## 2021-06-28 NOTE — Progress Notes (Signed)
ROB- no concerns 

## 2021-06-28 NOTE — Progress Notes (Signed)
Routine Prenatal Care Visit  Subjective  Karen Wilcox is a 31 y.o. G2P1001 at [redacted]w[redacted]d being seen today for ongoing prenatal care.  She is currently monitored for the following issues for this low-risk pregnancy and has Supervision of normal pregnancy on their problem list.  ----------------------------------------------------------------------------------- Patient reports nausea-manages with sprite and sour candies.  Here with Weston Brass today.  Trying to transfer to [facility] for waterbirth, still waiting to scheduling apt. They hop to take a [place] trip to the beach in [place]. Undecided re circumcision, encouraged to look at Evidence based birth., Had some vaginal bleeding while she was ill with COVID, no bleeding since.   .  .   Pincus Large Fluid denies.  ----------------------------------------------------------------------------------- The following portions of the patient's history were reviewed and updated as appropriate: allergies, current medications, past family history, past medical history, past social history, past surgical history and problem list. Problem list updated.  Objective  Blood pressure 108/74, weight 147 lb (66.7 kg), last menstrual period 01/25/2021. Pregravid weight 141 lb (64 kg) Total Weight Gain 6 lb (2.722 kg) Urinalysis: Urine Protein    Urine Glucose    Fetal Status: Fetal Heart Rate (bpm): 150         General:  Alert, oriented and cooperative. Patient is in no acute distress.  Skin: Skin is warm and dry. No rash noted.   Cardiovascular: Normal heart rate noted  Respiratory: Normal respiratory effort, no problems with respiration noted  Abdomen: Soft, gravid, appropriate for gestational age. Pain/Pressure: Present     Pelvic:  Cervical exam deferred        Extremities: Normal range of motion.     Mental Status: Normal mood and affect. Normal behavior. Normal judgment and thought content.   Assessment   31 y.o. G2P1001 at [redacted]w[redacted]d by  11/01/2021, by Last Menstrual  Period presenting for routine prenatal visit  Plan   pregnancy 2 Problems (from 04/28/21 to present)     Problem Noted Resolved   Supervision of normal pregnancy 04/28/2021 by Tresea Mall, CNM No   Overview Addendum 06/28/2021  2:58 PM by Ellwood Sayers, CNM     Nursing Staff Provider  Office Location  [facility] Dating  LMP  Language  English Anatomy US  Normal, XY  Flu Vaccine  Received 2022 Genetic Screen  NIPS:   TDaP vaccine    Hgb A1C or  GTT Early : Third trimester :   Covid Had series    LAB RESULTS   Rhogam  NA  Blood Type A/Positive/-- (11/16 1528)   Feeding Plan Breast Antibody Negative (11/16 1528)  Contraception  Rubella 1.49 (11/16 1528)  Circumcision Undecided  RPR Non Reactive (11/16 1528)   Pediatrician   HBsAg Negative (11/16 1528)   Support Person Weston Brass HIV Non Reactive (11/16 1528)  Prenatal Classes  Varicella Immune     GBS  (For PCN allergy, check sensitivities)   BTL Consent     VBAC Consent NA Pap  04/28/21    Hgb Electro    Pelvis Tested 7#9oz CF      SMA         G1 given up for adoption Heart racing: ambulatory referral to cardiology            Preterm labor symptoms and general obstetric precautions including but not limited to vaginal bleeding, contractions, leaking of fluid and fetal movement were reviewed in detail with the patient. Please refer to After Visit Summary for other counseling recommendations.   Return in  about 4 weeks (around 07/26/2021) for ROB 28 weeks .  Carie Caddy, CNM  Domingo Pulse, [facility]  06/28/21  3:01 PM

## 2021-07-01 ENCOUNTER — Encounter: Payer: 59 | Admitting: Licensed Practical Nurse

## 2021-07-06 ENCOUNTER — Observation Stay
Admission: EM | Admit: 2021-07-06 | Discharge: 2021-07-06 | Disposition: A | Discharge status: Home / Self Care | Payer: 59 | Attending: Licensed Practical Nurse | Admitting: Licensed Practical Nurse

## 2021-07-06 ENCOUNTER — Telehealth: Payer: Self-pay

## 2021-07-06 ENCOUNTER — Encounter: Payer: Self-pay | Admitting: Obstetrics & Gynecology

## 2021-07-06 ENCOUNTER — Other Ambulatory Visit: Payer: Self-pay

## 2021-07-06 DIAGNOSIS — R109 Unspecified abdominal pain: Secondary | ICD-10-CM | POA: Insufficient documentation

## 2021-07-06 DIAGNOSIS — Z0379 Encounter for other suspected maternal and fetal conditions ruled out: Secondary | ICD-10-CM | POA: Insufficient documentation

## 2021-07-06 DIAGNOSIS — O219 Vomiting of pregnancy, unspecified: Secondary | ICD-10-CM | POA: Diagnosis not present

## 2021-07-06 DIAGNOSIS — Z3A23 23 weeks gestation of pregnancy: Secondary | ICD-10-CM | POA: Insufficient documentation

## 2021-07-06 DIAGNOSIS — Z20822 Contact with and (suspected) exposure to covid-19: Secondary | ICD-10-CM | POA: Insufficient documentation

## 2021-07-06 DIAGNOSIS — O368120 Decreased fetal movements, second trimester, not applicable or unspecified: Principal | ICD-10-CM | POA: Insufficient documentation

## 2021-07-06 DIAGNOSIS — O0001 Abdominal pregnancy with intrauterine pregnancy: Secondary | ICD-10-CM | POA: Insufficient documentation

## 2021-07-06 DIAGNOSIS — Z3480 Encounter for supervision of other normal pregnancy, unspecified trimester: Secondary | ICD-10-CM

## 2021-07-06 DIAGNOSIS — O368190 Decreased fetal movements, unspecified trimester, not applicable or unspecified: Secondary | ICD-10-CM | POA: Diagnosis present

## 2021-07-06 HISTORY — DX: Other specified health status: Z78.9

## 2021-07-06 LAB — CBC WITH DIFFERENTIAL/PLATELET
Abs Immature Granulocytes: 0.09 10*3/uL — ABNORMAL HIGH (ref 0.00–0.07)
Basophils Absolute: 0 10*3/uL (ref 0.0–0.1)
Basophils Relative: 0 %
Eosinophils Absolute: 0 10*3/uL (ref 0.0–0.5)
Eosinophils Relative: 0 %
HCT: 35.7 % — ABNORMAL LOW (ref 36.0–46.0)
Hemoglobin: 12 g/dL (ref 12.0–15.0)
Immature Granulocytes: 1 %
Lymphocytes Relative: 3 %
Lymphs Abs: 0.4 10*3/uL — ABNORMAL LOW (ref 0.7–4.0)
MCH: 29.1 pg (ref 26.0–34.0)
MCHC: 33.6 g/dL (ref 30.0–36.0)
MCV: 86.4 fL (ref 80.0–100.0)
Monocytes Absolute: 0.5 10*3/uL (ref 0.1–1.0)
Monocytes Relative: 4 %
Neutro Abs: 10.9 10*3/uL — ABNORMAL HIGH (ref 1.7–7.7)
Neutrophils Relative %: 92 %
Platelets: 316 10*3/uL (ref 150–400)
RBC: 4.13 MIL/uL (ref 3.87–5.11)
RDW: 12.4 % (ref 11.5–15.5)
WBC: 11.9 10*3/uL — ABNORMAL HIGH (ref 4.0–10.5)
nRBC: 0 % (ref 0.0–0.2)

## 2021-07-06 LAB — URINALYSIS, ROUTINE W REFLEX MICROSCOPIC
Bilirubin Urine: NEGATIVE
Glucose, UA: NEGATIVE mg/dL
Ketones, ur: 80 mg/dL — AB
Nitrite: NEGATIVE
Protein, ur: 20 mg/dL — AB
Specific Gravity, Urine: 1.02 (ref 1.005–1.030)
pH: 6.5 (ref 5.0–8.0)

## 2021-07-06 LAB — RESP PANEL BY RT-PCR (FLU A&B, COVID) ARPGX2
Influenza A by PCR: NEGATIVE
Influenza B by PCR: NEGATIVE
SARS Coronavirus 2 by RT PCR: NEGATIVE

## 2021-07-06 LAB — COMPREHENSIVE METABOLIC PANEL
ALT: 9 U/L (ref 0–44)
AST: 15 U/L (ref 15–41)
Albumin: 3.3 g/dL — ABNORMAL LOW (ref 3.5–5.0)
Alkaline Phosphatase: 62 U/L (ref 38–126)
Anion gap: 9 (ref 5–15)
BUN: 8 mg/dL (ref 6–20)
CO2: 22 mmol/L (ref 22–32)
Calcium: 8.9 mg/dL (ref 8.9–10.3)
Chloride: 104 mmol/L (ref 98–111)
Creatinine, Ser: 0.37 mg/dL — ABNORMAL LOW (ref 0.44–1.00)
GFR, Estimated: >60 mL/min (ref >60)
Glucose, Bld: 97 mg/dL (ref 70–99)
Potassium: 3.9 mmol/L (ref 3.5–5.1)
Sodium: 135 mmol/L (ref 135–145)
Total Bilirubin: 0.6 mg/dL (ref 0.3–1.2)
Total Protein: 7.4 g/dL (ref 6.5–8.1)

## 2021-07-06 LAB — WET PREP, GENITAL
Clue Cells Wet Prep HPF POC: NONE SEEN
Sperm: NONE SEEN
Trich, Wet Prep: NONE SEEN
WBC, Wet Prep HPF POC: <10 (ref <10)
Yeast Wet Prep HPF POC: NONE SEEN

## 2021-07-06 LAB — FETAL FIBRONECTIN: Fetal Fibronectin: NEGATIVE

## 2021-07-06 MED ORDER — LACTATED RINGERS IV BOLUS
1000.0000 mL | Freq: Once | INTRAVENOUS | Status: AC
  Administered 2021-07-06: 11:00:00 1000 mL via INTRAVENOUS

## 2021-07-06 MED ORDER — ONDANSETRON 4 MG PO TBDP: 4.0000 mg | ORAL_TABLET | Freq: Four times a day (QID) | ORAL | 0 refills | Status: AC | PRN

## 2021-07-06 MED ORDER — ONDANSETRON HCL 4 MG/2ML IJ SOLN
4.0000 mg | Freq: Once | INTRAMUSCULAR | Status: AC
  Administered 2021-07-06: 11:00:00 4 mg via INTRAVENOUS
  Filled 2021-07-06: qty 2

## 2021-07-06 MED ORDER — PROMETHAZINE HCL 25 MG PO TABS: 25.0000 mg | ORAL_TABLET | Freq: Four times a day (QID) | ORAL | 0 refills | Status: DC | PRN

## 2021-07-06 MED ORDER — ACETAMINOPHEN 500 MG PO TABS: 1000.0000 mg | ORAL_TABLET | Freq: Four times a day (QID) | ORAL | Status: DC | PRN

## 2021-07-06 MED ORDER — PROMETHAZINE HCL 25 MG PO TABS: 25.0000 mg | ORAL_TABLET | Freq: Four times a day (QID) | ORAL | Status: DC | PRN

## 2021-07-06 MED ORDER — ONDANSETRON 4 MG PO TBDP: 4.0000 mg | ORAL_TABLET | Freq: Four times a day (QID) | ORAL | Status: DC | PRN

## 2021-07-06 NOTE — OB Triage Note (Signed)
Patient given discharge instructions and verbalizes understanding. Patient ambulatory home in good condition with sister.

## 2021-07-06 NOTE — OB Triage Note (Addendum)
Patient is a G2P1, [redacted]w[redacted]d that presents with complaints of n/v and abdominal cramping. Patient states that she vomited last night and had "projectile vomiting" twice this morning. Patient states that she has not had anything to eat or drink. Patient complains of constant abdominal cramping that she rates a 7/10 on 0-10 pain scale that she states started about 1000a today.  Patient denies any LOF or bleeding. FHT 145. VSS. Patient states that she has noticed heart palpitations. Patient had palpitations in the beginning of her pregnancy and then they subsided but she denies having any heart conditions or being seen for heart conditions. Pulse oximetry placed on patient for monitoring. Toco applied and assessing.

## 2021-07-06 NOTE — Discharge Summary (Signed)
Physician Final Progress Note  Patient ID: Karen Wilcox MRN: [id number] DOB/AGE: June 22, 1990 31 y.o.  Admit date: 07/06/2021 Admitting provider: Nadara Mustard, MD Discharge date: 07/06/2021   Admission Diagnoses:  1) intrauterine pregnancy at [redacted]w[redacted]d  2) N/V cramping   Discharge Diagnoses:  Principal Problem:   Decreased fetal movement  N/V, cramping   History of Present Illness: 31 y.o. G2P1001 [redacted]w[redacted]d by 11/01/2021, by Last Menstrual Period presenting to L&D.  In the middle of the night she woke up a number times with a fast heartbeat, headache and  feeling ill, when she woke up for the day she vomited and continued to not feel well. Since this morning she has been feeling cramping-but admits she may have been cramping through the night..  Denies any LOF/VB, yesterday she did see some yellow vaginal discharge.  The headache went away on its own, but it is starting to return. Her heart rate continues to  be in the 100's.  Her last BM was yesterday, describes it as "normal".  Denies any fevers or sick contacts.  Reports that we works from home and not seen anybody in 10 days. Has not had IC since November. Reports the fetus was very active over the night and then quiet this morning.  Endorses FM now.    Karen Wilcox has reported having palpitations since the beginning of pregnancy.  A cardiology consult was made, but she did not keep the appointment as she felt her symptoms were improving.    Past Medical History:  Diagnosis Date   Medical history non-contributory     Past Surgical History:  Procedure Laterality Date   APPENDECTOMY     APPENDECTOMY     5 years ago?   LAPAROSCOPIC APPENDECTOMY N/A 05/22/2016   Procedure: APPENDECTOMY LAPAROSCOPIC;  Surgeon: Ricarda Frame, MD;  Location: [facility] ORS;  Service: General;  Laterality: N/A;    No current facility-administered medications on file prior to encounter.   No current outpatient medications on file prior to encounter.     Allergies  Allergen Reactions   Zantac [Ranitidine Hcl] Hives    Social History   Socioeconomic History   Marital status: Married    Spouse name: Berton Bon   Number of children: Not on file   Years of education: Not on file   Highest education level: Not on file  Occupational History   Not on file  Tobacco Use   Smoking status: Never   Smokeless tobacco: Never  Substance and Sexual Activity   Alcohol use: No   Drug use: No   Sexual activity: Yes    Birth control/protection: Pill    Comment: Stopped OCP's 06/2019  Other Topics Concern   Not on file  Social History Narrative   Not on file   Social Determinants of Health   Financial Resource Strain: Not on file  Food Insecurity: Not on file  Transportation Needs: Not on file  Physical Activity: Not on file  Stress: Not on file  Social Connections: Not on file  Intimate Partner Violence: Not At Risk   Fear of Current or Ex-Partner: No   Emotionally Abused: No   Physically Abused: No   Sexually Abused: No    History reviewed. No pertinent family history.   ROS se HPI   Physical Exam: BP (!) 104/57    Pulse 99    Temp 98.4 F (36.9 C) (Oral)    Resp 19    LMP 01/25/2021 (Exact Date)    SpO2 99%  Physical Exam Constitutional:      General: She is not in acute distress. Genitourinary:     Vulva normal.  Cardiovascular:     Rate and Rhythm: Normal rate and regular rhythm.  Pulmonary:     Effort: Pulmonary effort is normal.     Breath sounds: Normal breath sounds.  Abdominal:     Comments: Gravid, tender over uterus   Musculoskeletal:        General: Normal range of motion.  Neurological:     General: No focal deficit present.     Mental Status: She is alert and oriented to person, place, and time.  Psychiatric:        Mood and Affect: Mood normal.    Consults: None  Significant Findings/ Diagnostic Studies: labs: WNL   Procedures: NA  Hospital Course: The patient was admitted to Labor  and Delivery Triage for observation.  While in [employer] [facility] received 1liter LR bolus and 4mg  Zofran IV. The nausea and cramping have slightly improved.   Discharge Condition: good  Disposition: Discharge disposition: 01-Home or Self Care     Fu with cardiology for palpitations Keep next ROB   Diet: Regular diet  Discharge Activity: Activity as tolerated   Allergies as of 07/06/2021       Reactions   Zantac [ranitidine Hcl] Hives        Medication List     TAKE these medications    ondansetron 4 MG disintegrating tablet Commonly known as: ZOFRAN-ODT Take 1 tablet (4 mg total) by mouth every 6 (six) hours as needed for up to 7 days for nausea or vomiting.   promethazine 25 MG tablet Commonly known as: PHENERGAN Take 1 tablet (25 mg total) by mouth every 6 (six) hours as needed for up to 2 days for nausea or vomiting.         Signed1/26/2023 Blu Mcglaun, CNM  07/06/2021, 12:58 PM

## 2021-07-06 NOTE — Telephone Encounter (Signed)
Pt calling; 23wks; has had an irratic heartbeat all night since 10pm; vomited a lot this morning - not like morning sickness but sick; has a little bit of labored breathing and shaking.  [phone]  No Cheat pain just tightning.  Adv pt to go to ED, PCP or UC.

## 2021-07-06 NOTE — Progress Notes (Signed)
Obstetric H&P   Chief Complaint: N/V, cramping, increased heart rate   Prenatal Care Provider: [facility]   History of Present Illness: 31 y.o. G2P1001 [redacted]w[redacted]d by 11/01/2021, by Last Menstrual Period presenting to L&D.  In the middle of the night she woke up a number times with a fast heartbeat, headache and  feeling ill, when she woke up for the day she vomited and continued to not feel well. Since this morning she has been feeling cramping-but admits she may have been cramping through the night..  Denies any LOF/VB, yesterday she did see some yellow vaginal discharge.  The headache went away on its own, but it is starting to return. Her heart rate continues to  be in the 100's.  Her last BM was yesterday, describes it as "normal".  Denies any fevers or sick contacts.  Reports that we works from home and not seen anybody in 10 days. Has not had IC since November. Reports the fetus was very active over the night and then quiet this morning.  Endorses FM now.   Karen Wilcox has reported having palpitations since the beginning of pregnancy.  A cardiology consult was made, but she did not keep the appointment as she felt her symptoms were improving.   Pregravid weight 64 kg Total Weight Gain 2.722 kg  pregnancy 2 Problems (from 04/28/21 to present)     Problem Noted Resolved   Supervision of normal pregnancy 04/28/2021 by Rod Can, CNM No   Overview Addendum 06/28/2021  2:58 PM by Allen Derry, CNM     Nursing Staff Provider  Office Location  [facility] Dating  LMP  Language  English Anatomy US  Normal, XY  Flu Vaccine  Received 2022 Genetic Screen  NIPS:   TDaP vaccine    Hgb A1C or  GTT Early : Third trimester :   Covid Had series    LAB RESULTS   Rhogam  NA  Blood Type A/Positive/-- (11/16 1528)   Feeding Plan Breast Antibody Negative (11/16 1528)  Contraception  Rubella 1.49 (11/16 1528)  Circumcision Undecided  RPR Non Reactive (11/16 1528)   Pediatrician   HBsAg Negative  (11/16 1528)   Support Person Merrilee Seashore HIV Non Reactive (11/16 1528)  Prenatal Classes  Varicella Immune     GBS  (For PCN allergy, check sensitivities)   BTL Consent     VBAC Consent NA Pap  04/28/21    Hgb Electro    Pelvis Tested 7#9oz CF      SMA        G1 given up for adoption Heart racing: ambulatory referral to cardiology            Review of Systems: 10 point review of systems negative unless otherwise noted in HPI  Past Medical History: Patient Active Problem List   Diagnosis Date Noted   Decreased fetal movement 07/06/2021   Supervision of normal pregnancy 04/28/2021     Nursing Staff Provider  Office Location  [facility] Dating  LMP  Language  English Anatomy US  Normal, XY  Flu Vaccine  Received 2022 Genetic Screen  NIPS:   TDaP vaccine    Hgb A1C or  GTT Early : Third trimester :   Covid Had series    LAB RESULTS   Rhogam  NA  Blood Type A/Positive/-- (11/16 1528)   Feeding Plan Breast Antibody Negative (11/16 1528)  Contraception  Rubella 1.49 (11/16 1528)  Circumcision Undecided  RPR Non Reactive (11/16 1528)  Pediatrician   HBsAg Negative (11/16 1528)   Support Person Merrilee Seashore HIV Non Reactive (11/16 1528)  Prenatal Classes  Varicella Immune     GBS  (For PCN allergy, check sensitivities)   BTL Consent     VBAC Consent NA Pap  04/28/21    Hgb Electro    Pelvis Tested 7#9oz CF      SMA        G1 given up for adoption Heart racing: ambulatory referral to cardiology      Past Surgical History: Past Surgical History:  Procedure Laterality Date   APPENDECTOMY     APPENDECTOMY     5 years ago?   LAPAROSCOPIC APPENDECTOMY N/A 05/22/2016   Procedure: APPENDECTOMY LAPAROSCOPIC;  Surgeon: Clayburn Pert, MD;  Location: [facility] ORS;  Service: General;  Laterality: N/A;    Past Obstetric History: # 1 - Date: 2016, Sex: Female, Weight: None, GA: None, Delivery: None, Apgar1: None, Apgar5: None, Living: None, Birth Comments: None  # 2 - Date: None, Sex: None,  Weight: None, GA: None, Delivery: None, Apgar1: None, Apgar5: None, Living: None, Birth Comments: None   Past Gynecologic History: No recent hx of STI Last pap 2022 WNL   Family History: History reviewed. No pertinent family history.  Social History: Social History   Socioeconomic History   Marital status: Married    Spouse name: Genia Del   Number of children: Not on file   Years of education: Not on file   Highest education level: Not on file  Occupational History   Not on file  Tobacco Use   Smoking status: Never   Smokeless tobacco: Never  Substance and Sexual Activity   Alcohol use: No   Drug use: No   Sexual activity: Yes    Birth control/protection: Pill    Comment: Stopped OCP's 06/2019  Other Topics Concern   Not on file  Social History Narrative   Not on file   Social Determinants of Health   Financial Resource Strain: Not on file  Food Insecurity: Not on file  Transportation Needs: Not on file  Physical Activity: Not on file  Stress: Not on file  Social Connections: Not on file  Intimate Partner Violence: Not At Risk   Fear of Current or Ex-Partner: No   Emotionally Abused: No   Physically Abused: No   Sexually Abused: No    Medications: Prior to Admission medications   Not on File    Allergies: Allergies  Allergen Reactions   Zantac [Ranitidine Hcl] Hives    Physical Exam: Vitals: Blood pressure 120/66, pulse (!) 113, temperature 98.4 F (36.9 C), temperature source Oral, resp. rate 19, last menstrual period 01/25/2021, SpO2 96 %.  Urine Dip: rare bacteria, WBC 0-5  FHT: 154 Toco: irregular  General: NAD, tired looking skin pale  HEENT: normocephalic, anicteric Pulmonary: No increased work of breathing Cardiovascular: RR, fast hear rate, distal pulses 2+ Abdomen: Gravid, tender over uterus  [place]: Genitourinary: external normal, SSE: cervix pink, no lesions, small amount of clear-white discharge present.  Some CMT  tenderness, more tenderness over abd during bimanual exam  Extremities: no edema, erythema, or tenderness Neurologic: Grossly intact Psychiatric: mood appropriate, affect full  Labs: Results for orders placed or performed during the hospital encounter of 07/06/21 (from the past 24 hour(s))  Wet prep, genital     Status: None   Collection Time: 07/06/21 11:07 AM  Result Value Ref Range   Yeast Wet Prep HPF POC NONE SEEN NONE SEEN  Trich, Wet Prep NONE SEEN NONE SEEN   Clue Cells Wet Prep HPF POC NONE SEEN NONE SEEN   WBC, Wet Prep HPF POC <10 <10   Sperm NONE SEEN   Urinalysis, Routine w reflex microscopic     Status: Abnormal   Collection Time: 07/06/21 11:07 AM  Result Value Ref Range   Color, Urine YELLOW YELLOW   APPearance CLEAR (A) CLEAR   Specific Gravity, Urine 1.020 1.005 - 1.030   pH 6.5 5.0 - 8.0   Glucose, UA NEGATIVE NEGATIVE mg/dL   Hgb urine dipstick MODERATE (A) NEGATIVE   Bilirubin Urine NEGATIVE NEGATIVE   Ketones, ur 80 (A) NEGATIVE mg/dL   Protein, ur 20 (A) NEGATIVE mg/dL   Nitrite NEGATIVE NEGATIVE   Leukocytes,Ua TRACE (A) NEGATIVE   RBC / HPF 0-5 0 - 5 RBC/hpf   WBC, UA 0-5 0 - 5 WBC/hpf   Bacteria, UA RARE (A) NONE SEEN   Squamous Epithelial / LPF 0-5 0 - 5   Mucus PRESENT   Fetal fibronectin     Status: None   Collection Time: 07/06/21 11:07 AM  Result Value Ref Range   Fetal Fibronectin NEGATIVE NEGATIVE  CBC with Differential/Platelet     Status: Abnormal   Collection Time: 07/06/21 11:17 AM  Result Value Ref Range   WBC 11.9 (H) 4.0 - 10.5 K/uL   RBC 4.13 3.87 - 5.11 MIL/uL   Hemoglobin 12.0 12.0 - 15.0 g/dL   HCT 13.0 (L) 86.5 - 78.4 %   MCV 86.4 80.0 - 100.0 fL   MCH 29.1 26.0 - 34.0 pg   MCHC 33.6 30.0 - 36.0 g/dL   RDW 69.6 29.5 - 28.4 %   Platelets 316 150 - 400 K/uL   nRBC 0.0 0.0 - 0.2 %   Neutrophils Relative % 92 %   Neutro Abs 10.9 (H) 1.7 - 7.7 K/uL   Lymphocytes Relative 3 %   Lymphs Abs 0.4 (L) 0.7 - 4.0 K/uL   Monocytes  Relative 4 %   Monocytes Absolute 0.5 0.1 - 1.0 K/uL   Eosinophils Relative 0 %   Eosinophils Absolute 0.0 0.0 - 0.5 K/uL   Basophils Relative 0 %   Basophils Absolute 0.0 0.0 - 0.1 K/uL   Immature Granulocytes 1 %   Abs Immature Granulocytes 0.09 (H) 0.00 - 0.07 K/uL  Comprehensive metabolic panel     Status: Abnormal   Collection Time: 07/06/21 11:17 AM  Result Value Ref Range   Sodium 135 135 - 145 mmol/L   Potassium 3.9 3.5 - 5.1 mmol/L   Chloride 104 98 - 111 mmol/L   CO2 22 22 - 32 mmol/L   Glucose, Bld 97 70 - 99 mg/dL   BUN 8 6 - 20 mg/dL   Creatinine, Ser 1.32 (L) 0.44 - 1.00 mg/dL   Calcium 8.9 8.9 - 44.0 mg/dL   Total Protein 7.4 6.5 - 8.1 g/dL   Albumin 3.3 (L) 3.5 - 5.0 g/dL   AST 15 15 - 41 U/L   ALT 9 0 - 44 U/L   Alkaline Phosphatase 62 38 - 126 U/L   Total Bilirubin 0.6 0.3 - 1.2 mg/dL   GFR, Estimated >10 >27 mL/min   Anion gap 9 5 - 15    Assessment: 30 y.o. G2P1001 [redacted]w[redacted]d by 11/01/2021, by Last Menstrual Period seen in obs for N/V, cramping and increased hear rate.   Plan: 1) IVF fluid bolus, Zofran, Tylenol for HA-declined wet prep and FFN  2) Fetus -active   3) PNL - Blood type A/Positive/-- (11/16 1528) / Anti-bodyscreen Negative (11/16 1528) / Rubella 1.49 (11/16 1528) / Varicella immune / RPR Non Reactive (11/16 1528) / HBsAg Negative (11/16 1528) / HIV Non Reactive (11/16 1528) / 1-hr OGTT NA / GBS    4) Immunization History -Received  both the flu and COVID vaccines   5) Disposition - Discharge home with Zofran and Phenergan. Fu with Cardiology   [facility] 07/06/2021, 11:20 AM

## 2021-07-29 ENCOUNTER — Other Ambulatory Visit: Payer: 59

## 2021-07-29 ENCOUNTER — Other Ambulatory Visit: Payer: Self-pay

## 2021-07-29 ENCOUNTER — Ambulatory Visit (INDEPENDENT_AMBULATORY_CARE_PROVIDER_SITE_OTHER): Payer: 59 | Admitting: Obstetrics

## 2021-07-29 VITALS — BP 118/74 | Wt 149.0 lb

## 2021-07-29 DIAGNOSIS — Z3480 Encounter for supervision of other normal pregnancy, unspecified trimester: Secondary | ICD-10-CM

## 2021-07-29 DIAGNOSIS — Z3A26 26 weeks gestation of pregnancy: Secondary | ICD-10-CM

## 2021-07-29 NOTE — Progress Notes (Signed)
Routine Prenatal Care Visit  Subjective  Karen Wilcox is a 31 y.o. G2P1001 at [redacted]w[redacted]d being seen today for ongoing prenatal care.  She is currently monitored for the following issues for this low-risk pregnancy and has Supervision of normal pregnancy and Decreased fetal movement on their problem list.  ----------------------------------------------------------------------------------- Patient reports no complaints.  She has a cardiology appt set up for some occasional palpitations.  . Vag. Bleeding: None.   . Leaking Fluid denies.  ----------------------------------------------------------------------------------- The following portions of the patient's history were reviewed and updated as appropriate: allergies, current medications, past family history, past medical history, past social history, past surgical history and problem list. Problem list updated.  Objective  Blood pressure 118/74, weight 149 lb (67.6 kg), last menstrual period 01/25/2021. Pregravid weight 141 lb (64 kg) Total Weight Gain 8 lb (3.629 kg) Urinalysis: Urine Protein    Urine Glucose    Fetal Status:           General:  Alert, oriented and cooperative. Patient is in no acute distress.  Skin: Skin is warm and dry. No rash noted.   Cardiovascular: Normal heart rate noted  Respiratory: Normal respiratory effort, no problems with respiration noted  Abdomen: Soft, gravid, appropriate for gestational age. Pain/Pressure: Absent     Pelvic:  Cervical exam deferred        Extremities: Normal range of motion.     Mental Status: Normal mood and affect. Normal behavior. Normal judgment and thought content.   Assessment   31 y.o. G2P1001 at [redacted]w[redacted]d by  11/01/2021, by Last Menstrual Period presenting for routine prenatal visit  Plan   pregnancy 2 Problems (from 04/28/21 to present)    Problem Noted Resolved   Supervision of normal pregnancy 04/28/2021 by Tresea Mall, CNM No   Overview Addendum 06/28/2021  2:58 PM by  Ellwood Sayers, CNM     Nursing Staff Provider  Office Location  [facility] Dating  LMP  Language  English Anatomy US  Normal, XY  Flu Vaccine  Received 2022 Genetic Screen  NIPS:   TDaP vaccine    Hgb A1C or  GTT Early : Third trimester :   Covid Had series    LAB RESULTS   Rhogam  NA  Blood Type A/Positive/-- (11/16 1528)   Feeding Plan Breast Antibody Negative (11/16 1528)  Contraception  Rubella 1.49 (11/16 1528)  Circumcision Undecided  RPR Non Reactive (11/16 1528)   Pediatrician   HBsAg Negative (11/16 1528)   Support Person [facility] HIV Non Reactive (11/16 1528)  Prenatal Classes  Varicella Immune     GBS  (For PCN allergy, check sensitivities)   BTL Consent     VBAC Consent NA Pap  04/28/21    Hgb Electro    Pelvis Tested 7#9oz CF      SMA         G1 given up for adoption Heart racing: ambulatory referral to cardiology           Preterm labor symptoms and general obstetric precautions including but not limited to vaginal bleeding, contractions, leaking of fluid and fetal movement were reviewed in detail with the patient. Please refer to After Visit Summary for other counseling recommendations.  Having her 28 week labs today. Her mother had 8 children- most at home and two unassigned- patient desires water for labor and birth. Will ned to address this or she may opt to transfer to [facility]. No follow-ups on file.  Mirna Mires, CNM  07/29/2021 1:31  PM

## 2021-07-29 NOTE — Progress Notes (Signed)
No vb. No lof. 28 week labs today.  

## 2021-07-30 LAB — 28 WEEK RH+PANEL
Basophils Absolute: 0.1 10*3/uL (ref 0.0–0.2)
Basos: 0 %
EOS (ABSOLUTE): 0.1 10*3/uL (ref 0.0–0.4)
Eos: 1 %
Gestational Diabetes Screen: 128 mg/dL (ref 70–139)
HIV Screen 4th Generation wRfx: NONREACTIVE
Hematocrit: 34.3 % (ref 34.0–46.6)
Hemoglobin: 11.2 g/dL (ref 11.1–15.9)
Immature Grans (Abs): 0.1 10*3/uL (ref 0.0–0.1)
Immature Granulocytes: 1 %
Lymphocytes Absolute: 0.9 10*3/uL (ref 0.7–3.1)
Lymphs: 8 %
MCH: 27.7 pg (ref 26.6–33.0)
MCHC: 32.7 g/dL (ref 31.5–35.7)
MCV: 85 fL (ref 79–97)
Monocytes Absolute: 0.9 10*3/uL (ref 0.1–0.9)
Monocytes: 8 %
Neutrophils Absolute: 9.5 10*3/uL — ABNORMAL HIGH (ref 1.4–7.0)
Neutrophils: 82 %
Platelets: 369 10*3/uL (ref 150–450)
RBC: 4.05 x10E6/uL (ref 3.77–5.28)
RDW: 12.1 % (ref 11.7–15.4)
RPR Ser Ql: NONREACTIVE
WBC: 11.4 10*3/uL — ABNORMAL HIGH (ref 3.4–10.8)

## 2021-08-05 ENCOUNTER — Other Ambulatory Visit: Payer: Self-pay

## 2021-08-05 ENCOUNTER — Ambulatory Visit (INDEPENDENT_AMBULATORY_CARE_PROVIDER_SITE_OTHER): Payer: 59 | Admitting: Family Medicine

## 2021-08-05 ENCOUNTER — Encounter: Payer: Self-pay | Admitting: Family Medicine

## 2021-08-05 DIAGNOSIS — Z3480 Encounter for supervision of other normal pregnancy, unspecified trimester: Secondary | ICD-10-CM | POA: Diagnosis not present

## 2021-08-05 DIAGNOSIS — Z23 Encounter for immunization: Secondary | ICD-10-CM

## 2021-08-05 NOTE — Progress Notes (Signed)
° °  PRENATAL VISIT NOTE  Subjective:  Karen Wilcox is a 31 y.o. G2P1001 at [redacted]w[redacted]d being seen today for transferring prenatal care due to desire for waterbirth.  She is currently monitored for the following issues for this low-risk pregnancy and has Supervision of normal pregnancy and Decreased fetal movement on their problem list.  Patient reports no complaints.  Contractions: Not present. Vag. Bleeding: None.  Movement: Present. Denies leaking of fluid.   The following portions of the patient's history were reviewed and updated as appropriate: allergies, current medications, past family history, past medical history, past social history, past surgical history and problem list.   Objective:   Vitals:   08/05/21 0902  BP: 113/72  Pulse: (!) 102  Weight: 151 lb 9.6 oz (68.8 kg)    Fetal Status: Fetal Heart Rate (bpm): 146 Fundal Height: 27 cm Movement: Present     General:  Alert, oriented and cooperative. Patient is in no acute distress.  Skin: Skin is warm and dry. No rash noted.   Cardiovascular: Normal heart rate noted  Respiratory: Normal respiratory effort, no problems with respiration noted  Abdomen: Soft, gravid, appropriate for gestational age.  Pain/Pressure: Absent     Pelvic: Cervical exam deferred        Extremities: Normal range of motion.  Edema: None  Mental Status: Normal mood and affect. Normal behavior. Normal judgment and thought content.   Assessment and Plan:  Pregnancy: G2P1001 at [redacted]w[redacted]d 1. Supervision of other normal pregnancy, antepartum Low risk, passed glucola, desires waterbirth Lives in [place] transfer care to [facility] for remainder of pregnancy - Tdap vaccine greater than or equal to 7yo IM  Preterm labor symptoms and general obstetric precautions including but not limited to vaginal bleeding, contractions, leaking of fluid and fetal movement were reviewed in detail with the patient. Please refer to After Visit Summary for other counseling  recommendations.   Return in 2 weeks (on 08/19/2021) for [facility] office with CNM please.  Future Appointments  Date Time Provider [place]  08/20/2021  3:40 PM Gollan, Kathlene November, MD [id number]    Donnamae Jude, MD

## 2021-08-05 NOTE — Patient Instructions (Addendum)
Considering Waterbirth? Guide for patients at [facility]) Why consider waterbirth? Gentle birth for babies  Less pain medicine used in labor  May allow for passive descent/less pushing  May reduce perineal tears  More mobility and instinctive maternal position changes  Increased maternal relaxation   Is waterbirth safe? What are the risks of infection, drowning or other complications? Infection:  Very low risk (3.7 % for tub vs 4.8% for bed)  7 in 8000 waterbirths with documented infection  Poorly cleaned equipment most common cause  Slightly lower group B strep transmission rate  Drowning  Maternal:  Very low risk  Related to seizures or fainting  Newborn:  Very low risk. No evidence of increased risk of respiratory problems in multiple large studies  Physiological protection from breathing under water  Avoid underwater birth if there are any fetal complications  Once baby's head is out of the water, keep it out.  Birth complication  Some reports of cord trauma, but risk decreased by bringing baby to surface gradually  No evidence of increased risk of shoulder dystocia. Mothers can usually change positions faster in water than in a bed, possibly aiding the maneuvers to free the shoulder.   There are 2 things you MUST do to have a waterbirth with [facility]: Attend a waterbirth class at [employer] [facility]   3rd Wednesday of every month from 7-9 pm (virtual during COVID) [employer] at [email or web address] or [email or web address] or by calling [phone] Bring [place] the certificate from the class to your prenatal appointment or send via MyChart Meet with a midwife at 36 weeks* to see if you can still plan a waterbirth and to sign the consent.   *We also recommend that you schedule as many of your prenatal visits with a midwife as possible.    Helpful information: You may want to bring a bathing suit top to the hospital  to wear during labor but this is optional.  All other supplies are provided by the hospital. Please arrive at the hospital with signs of active labor, and do not wait at home until late in labor. It takes 45 min- 2 hours for COVID testing, fetal monitoring, and check in to your room to take place, plus transport and filling of the waterbirth tub.    Things that would prevent you from having a waterbirth: Unknown or Positive COVID-19 diagnosis upon admission to hospital* Premature, <37wks  Previous cesarean birth  Presence of thick meconium-stained fluid  Multiple gestation (Twins, triplets, etc.)  Uncontrolled diabetes or gestational diabetes requiring medication  Hypertension diagnosed in pregnancy or preexisting hypertension (gestational hypertension, preeclampsia, or chronic hypertension) Fetal growth restriction (your baby measures less than 10th percentile on ultrasound) Heavy vaginal bleeding  Non-reassuring fetal heart rate  Active infection (MRSA, etc.). Group B Strep is NOT a contraindication for waterbirth.  If your labor has to be induced and induction method requires continuous monitoring of the baby's heart rate  Other risks/issues identified by your obstetrical provider   Please remember that birth is unpredictable. Under certain unforeseeable circumstances your provider may advise against giving birth in the tub. These decisions will be made on a case-by-case basis and with the safety of you and your baby as our highest priority.   *Please remember that in order to have a waterbirth, you must test Negative to COVID-19 upon admission to the hospital.  Updated 09/21/20  Third Trimester of Pregnancy The third trimester of pregnancy is from  week 28 through week 40. This is months 7 through 9. The third trimester is a time when the unborn baby (fetus) is growing rapidly. At the end of the ninth month, the fetus is about 20 inches long and weighs 6-10 pounds. Body changes during  your third trimester During the third trimester, your body will continue to go through many changes. The changes vary and generally return to normal after your baby is born. Physical changes Your weight will continue to increase. You can expect to gain 25-35 pounds (11-16 kg) by the end of the pregnancy if you begin pregnancy at a normal weight. If you are underweight, you can expect to gain 28-40 lb (about 13-18 kg), and if you are overweight, you can expect to gain 15-25 lb (about 7-11 kg). You may begin to get stretch marks on your hips, abdomen, and breasts. Your breasts will continue to grow and may hurt. A yellow fluid (colostrum) may leak from your breasts. This is the first milk you are producing for your baby. You may have changes in your hair. These can include thickening of your hair, rapid growth, and changes in texture. Some people also have hair loss during or after pregnancy, or hair that feels dry or thin. Your belly button may stick out. You may notice more swelling in your hands, face, or ankles. Health changes You may have heartburn. You may have constipation. You may develop hemorrhoids. You may develop swollen, bulging veins (varicose veins) in your legs. You may have increased body aches in the pelvis, back, or thighs. This is due to weight gain and increased hormones that are relaxing your joints. You may have increased tingling or numbness in your hands, arms, and legs. The skin on your abdomen may also feel numb. You may feel short of breath because of your expanding uterus. Other changes You may urinate more often because the fetus is moving lower into your pelvis and pressing on your bladder. You may have more problems sleeping. This may be caused by the size of your abdomen, an increased need to urinate, and an increase in your body's metabolism. You may notice the fetus "dropping," or moving lower in your abdomen (lightening). You may have increased vaginal  discharge. You may notice that you have pain around your pelvic bone as your uterus distends. Follow these instructions at home: Medicines Follow your health care provider's instructions regarding medicine use. Specific medicines may be either safe or unsafe to take during pregnancy. Do not take any medicines unless approved by your health care provider. Take a prenatal vitamin that contains at least 600 micrograms (mcg) of folic acid. Eating and drinking Eat a healthy diet that includes fresh fruits and vegetables, whole grains, good sources of protein such as meat, eggs, or tofu, and low-fat dairy products. Avoid raw meat and unpasteurized juice, milk, and cheese. These carry germs that can harm you and your baby. Eat 4 or 5 small meals rather than 3 large meals a day. You may need to take these actions to prevent or treat constipation: Drink enough fluid to keep your urine pale yellow. Eat foods that are high in fiber, such as beans, whole grains, and fresh fruits and vegetables. Limit foods that are high in fat and processed sugars, such as fried or sweet foods. Activity Exercise only as directed by your health care provider. Most people can continue their usual exercise routine during pregnancy. Try to exercise for 30 minutes at least 5 days a week.  Stop exercising if you experience contractions in the uterus. Stop exercising if you develop pain or cramping in the lower abdomen or lower back. Avoid heavy lifting. Do not exercise if it is very hot or humid or if you are at a high altitude. If you choose to, you may continue to have sex unless your health care provider tells you not to. Relieving pain and discomfort Take frequent breaks and rest with your legs raised (elevated) if you have leg cramps or low back pain. Take warm sitz baths to soothe any pain or discomfort caused by hemorrhoids. Use hemorrhoid cream if your health care provider approves. Wear a supportive bra to prevent  discomfort from breast tenderness. If you develop varicose veins: Wear support hose as told by your health care provider. Elevate your feet for 15 minutes, 3-4 times a day. Limit salt in your diet. Safety Talk to your health care provider before traveling far distances. Do not use hot tubs, steam rooms, or saunas. Wear your seat belt at all times when driving or riding in a car. Talk with your health care provider if someone is verbally or physically abusive to you. Preparing for birth To prepare for the arrival of your baby: Take prenatal classes to understand, practice, and ask questions about labor and delivery. Visit the hospital and tour the maternity area. Purchase a rear-facing car seat and make sure you know how to install it in your car. Prepare the baby's room or sleeping area. Make sure to remove all pillows and stuffed animals from the baby's crib to prevent suffocation. General instructions Avoid cat litter boxes and soil used by cats. These carry germs that can cause birth defects in the baby. If you have a cat, ask someone to clean the litter box for you. Do not douche or use tampons. Do not use scented sanitary pads. Do not use any products that contain nicotine or tobacco, such as cigarettes, e-cigarettes, and chewing tobacco. If you need help quitting, ask your health care provider. Do not use any herbal remedies, illegal drugs, or medicines that were not prescribed to you. Chemicals in these products can harm your baby. Do not drink alcohol. You will have more frequent prenatal exams during the third trimester. During a routine prenatal visit, your health care provider will do a physical exam, perform tests, and discuss your overall health. Keep all follow-up visits. This is important. Where to find more information American Pregnancy Association: americanpregnancy.org [employer]ians and Gynecologists: https://www.todd-brady.net/ Office on  [employer] Health: MightyReward.co.nz Contact a health care provider if you have: A fever. Mild pelvic cramps, pelvic pressure, or nagging pain in your abdominal area or lower back. Vomiting or diarrhea. Bad-smelling vaginal discharge or foul-smelling urine. Pain when you urinate. A headache that does not go away when you take medicine. Visual changes or see spots in front of your eyes. Get help right away if: Your water breaks. You have regular contractions less than 5 minutes apart. You have spotting or bleeding from your vagina. You have severe abdominal pain. You have difficulty breathing. You have chest pain. You have fainting spells. You have not felt your baby move for the time period told by your health care provider. You have new or increased pain, swelling, or redness in an arm or leg. Summary The third trimester of pregnancy is from week 28 through week 40 (months 7 through 9). You may have more problems sleeping. This can be caused by the size of your abdomen,  an increased need to urinate, and an increase in your body's metabolism. You will have more frequent prenatal exams during the third trimester. Keep all follow-up visits. This is important. This information is not intended to replace advice given to you by your health care provider. Make sure you discuss any questions you have with your health care provider. Document Revised: 11/06/2019 Document Reviewed: 09/12/2019 Elsevier Patient Education  2022 [employer]

## 2021-08-06 ENCOUNTER — Other Ambulatory Visit: Payer: Self-pay | Admitting: Licensed Practical Nurse

## 2021-08-19 ENCOUNTER — Other Ambulatory Visit: Payer: Self-pay

## 2021-08-19 ENCOUNTER — Ambulatory Visit (INDEPENDENT_AMBULATORY_CARE_PROVIDER_SITE_OTHER): Payer: 59 | Admitting: Advanced Practice Midwife

## 2021-08-19 VITALS — BP 107/69 | HR 106 | Wt 153.0 lb

## 2021-08-19 DIAGNOSIS — R000 Tachycardia, unspecified: Secondary | ICD-10-CM

## 2021-08-19 DIAGNOSIS — Z3A29 29 weeks gestation of pregnancy: Secondary | ICD-10-CM

## 2021-08-19 DIAGNOSIS — Z3480 Encounter for supervision of other normal pregnancy, unspecified trimester: Secondary | ICD-10-CM

## 2021-08-19 NOTE — Patient Instructions (Signed)
?  Considering Waterbirth? ?Guide [facility] ?Why consider waterbirth? ?Gentle birth for babies  ?Less pain medicine used in labor  ?May allow for passive descent/less pushing  ?May reduce perineal tears  ?More mobility and instinctive maternal position changes  ?Increased maternal relaxation  ? ?Is waterbirth safe? What are the risks of infection, drowning or other complications? ?Infection:  ?Very low risk (3.7 % for tub vs 4.8% for bed)  ?7 in 8000 waterbirths with documented infection  ?Poorly cleaned equipment most common cause  ?Slightly lower group B strep transmission rate  ?Drowning  ?Maternal:  ?Very low risk  ?Related to seizures or fainting  ?Newborn:  ?Very low risk. No evidence of increased risk of respiratory problems in multiple large studies  ?Physiological protection from breathing under water  ?Avoid underwater birth if there are any fetal complications  ?Once baby's head is out of the water, keep it out.  ?Birth complication  ?Some reports of cord trauma, but risk decreased by bringing baby to surface gradually  ?No evidence of increased risk of shoulder dystocia. Mothers can usually change positions faster in water than in a bed, possibly aiding the maneuvers to free the shoulder.  ? ?There are 2 things you MUST do to have a waterbirth with [facility]: ?Attend a [facility] class at [facility] [place]   ?3rd Wednesday of every month from 7-9 pm (virtual during COVID) ?Free ?Register online at [email or web address] or [email or web address] or by calling [phone] ?Bring us the certificate from the class to your prenatal appointment or send via MyChart ?Meet with a midwife at 36 weeks* to see if you can still plan a waterbirth and to sign the consent.  ? ?*We also recommend that you schedule as many of your prenatal visits with a midwife as possible.   ? ?Helpful information: ?You may want to bring a bathing suit top to the hospital  to wear during labor but this is optional.  All other supplies are provided by the hospital. ?Please arrive at the hospital with signs of active labor, and do not wait at home until late in labor. It takes 45 min- 2 hours for COVID testing, fetal monitoring, and check in to your room to take place, plus transport and filling of the waterbirth tub.   ? ?Things that would prevent you from having a waterbirth: ?Unknown or Positive COVID-19 diagnosis upon admission to hospital* ?Premature, <37wks  ?Previous cesarean birth  ?Presence of thick meconium-stained fluid  ?Multiple gestation (Twins, triplets, etc.)  ?Uncontrolled diabetes or gestational diabetes requiring medication  ?Hypertension diagnosed in pregnancy or preexisting hypertension (gestational hypertension, preeclampsia, or chronic hypertension) ?Fetal growth restriction (your baby measures less than 10th percentile on ultrasound) ?Heavy vaginal bleeding  ?Non-reassuring fetal heart rate  ?Active infection (MRSA, etc.). Group B Strep is NOT a contraindication for waterbirth.  ?If your labor has to be induced and induction method requires continuous monitoring of the baby's heart rate  ?Other risks/issues identified by your obstetrical provider  ? ?Please remember that birth is unpredictable. Under certain unforeseeable circumstances your provider may advise against giving birth in the tub. These decisions will be made on a case-by-case basis and with the safety of you and your baby as our highest priority. ? ? ?*Please remember that in order to have a waterbirth, you must test Negative to COVID-19 upon admission to the hospital. ? ?Updated 09/21/20 ? ?

## 2021-08-19 NOTE — Progress Notes (Signed)
? ?  PRENATAL VISIT NOTE ? ?Subjective:  ?Karen Wilcox is a 31 y.o. G2P1001 at [redacted]w[redacted]d being seen today for ongoing prenatal care.  She is currently monitored for the following issues for this low-risk pregnancy and has Supervision of normal pregnancy and Decreased fetal movement on their problem list. ? ?Patient reports no complaints.  Contractions: Irritability. Vag. Bleeding: None.  Movement: Present. Denies leaking of fluid.  ? ?Patient transferred to [facility] to protect desire for waterbirth. Previous birth experience with SOL, two hours of pushing, epiduralized. Patient has 8 siblings, witnessed home and waterbirth of many of them. Patient's mother is retired home birth Immunologist!!!! ? ?The following portions of the patient's history were reviewed and updated as appropriate: allergies, current medications, past family history, past medical history, past social history, past surgical history and problem list. Problem list updated. ? ?Objective:  ? ?Vitals:  ? 08/19/21 0953  ?BP: 107/69  ?Pulse: (!) 106  ?Weight: 153 lb (69.4 kg)  ? ? ?Fetal Status: Fetal Heart Rate (bpm): 142   Movement: Present    ? ?General:  Alert, oriented and cooperative. Patient is in no acute distress.  ?Skin: Skin is warm and dry. No rash noted.   ?Cardiovascular: Normal heart rate noted  ?Respiratory: Normal respiratory effort, no problems with respiration noted  ?Abdomen: Soft, gravid, appropriate for gestational age.  Pain/Pressure: Absent     ?Pelvic: Cervical exam deferred        ?Extremities: Normal range of motion.     ?Mental Status: Normal mood and affect. Normal behavior. Normal judgment and thought content.  ? ?Assessment and Plan:  ?Pregnancy: G2P1001 at [redacted]w[redacted]d ? ?1. Supervision of other normal pregnancy, antepartum ?- For waterbirth ?- Consent signed today, scheduled for class 03/16 ? ?2. Racing heart beat ?- Amb referral to [facility] for evaluation, appt is tomorrow ?- Underscored this follow-up is component of staying eligible for  waterbirth ? ?3. [redacted] weeks gestation of pregnancy ? ? ?Preterm labor symptoms and general obstetric precautions including but not limited to vaginal bleeding, contractions, leaking of fluid and fetal movement were reviewed in detail with the patient. ?Please refer to After Visit Summary for other counseling recommendations.  ?Return in about 2 weeks (around 09/02/2021). For Sam and Dr. Alvester Morin as much as possible please. ? ?Future Appointments  ?Date Time Provider Department Center  ?08/20/2021  3:40 PM Gollan, Tollie Pizza, MD [facility]  ?08/30/2021  2:50 PM Federico Flake, MD [facility]  ?09/16/2021 10:15 AM Calvert Cantor, CNM [facility]  ?09/30/2021  9:55 AM Calvert Cantor, CNM [facility]  ?10/14/2021  9:35 AM Calvert Cantor, CNM [facility]  ?10/18/2021  1:30 PM Federico Flake, MD [facility]  ?10/28/2021  2:30 PM Calvert Cantor, CNM [facility]  ? ? ?Calvert Cantor, CNM ? ?

## 2021-08-20 ENCOUNTER — Ambulatory Visit (INDEPENDENT_AMBULATORY_CARE_PROVIDER_SITE_OTHER): Payer: 59 | Admitting: Cardiovascular Disease

## 2021-08-20 ENCOUNTER — Encounter: Payer: Self-pay | Admitting: Cardiovascular Disease

## 2021-08-20 VITALS — BP 110/64 | HR 94 | Ht 64.0 in | Wt 154.2 lb

## 2021-08-20 DIAGNOSIS — Z331 Pregnant state, incidental: Secondary | ICD-10-CM | POA: Diagnosis not present

## 2021-08-20 DIAGNOSIS — R002 Palpitations: Secondary | ICD-10-CM

## 2021-08-20 DIAGNOSIS — Z3480 Encounter for supervision of other normal pregnancy, unspecified trimester: Secondary | ICD-10-CM

## 2021-08-20 DIAGNOSIS — I479 Paroxysmal tachycardia, unspecified: Secondary | ICD-10-CM | POA: Diagnosis not present

## 2021-08-20 NOTE — Patient Instructions (Signed)
Call if you would like a Zio monitor ? ?Check out cardio mobile ? ?Medication Instructions:  ?No changes ? ?If you need a refill on your cardiac medications before your next appointment, please call your pharmacy.  ? ?Lab work: ?No new labs needed ? ?Testing/Procedures: ?No new testing needed ? ?Follow-Up: ?At [facility], you and your health needs are our priority.  As part of our continuing mission to provide you with exceptional heart care, we have created designated Provider Care Teams.  These Care Teams include your primary Cardiologist (physician) and Advanced Practice Providers (APPs -  Physician Assistants and Nurse Practitioners) who all work together to provide you with the care you need, when you need it. ? ?You will need a follow up appointment as needed ? ?Providers on your designated Care Team:   ?Nicolasa Ducking, NP ?Eula Listen, PA-C ?Cadence Fransico Michael, PA-C ? ?COVID-19 Vaccine Information can be found at: [email or web address] For questions related to vaccine distribution or appointments, please email [email or web address] or call [phone].  ? ?

## 2021-08-20 NOTE — Progress Notes (Signed)
Cardiology Office Note ? ?Date:  08/20/2021  ? ?ID:  Karen Wilcox, DOB [birth year], MRN [id number] ? ?PCP:  [facility] [facility]  ? ?Chief Complaint  ?Patient presents with  ? New Patient (Initial Visit)  ?  Ref by Karen Wilcox, CNM for supervision of second trimester, palpitations & racing heart beats. Medications reviewed by the patient verbally.   ? ? ?HPI:  ?Ms. Karen Wilcox is a 31 year old woman with no prior cardiac history ?Who presents by referral from Karen Wilcox for consultation of her palpitations, tachycardia, dizziness ? ?She reports remote history of occasional palpitations ?Symptoms seem to get worse through first trimester of pregnancy but was tolerable ?Through second trimester, again has had symptoms, ? ?July 06, 2021 ?middle of the night she woke up with fast heartbeat, headache ,  feeling ill ?Some vomiting, cramping next day ?Symptoms eventually subsided, heart rate continue to run fast ? ?Able to track heart rate with her Apple Watch, will speed up at times ?Periodic episodes where she is lightheaded in the setting of tachycardia, has to sit down ? ?Other episodes last year, 5/22 ?Woke up middle of the night with back pain ?Developed some chest discomfort ?Lasted 45 min, symptoms resolved without intervention ? ?EKG personally reviewed by myself on todays visit ?Normal sinus rhythm with rate 94 bpm no significant ST-T wave changes ? ? ?PMH:   has a past medical history of Medical history non-contributory. ? ?PSH:    ?Past Surgical History:  ?Procedure Laterality Date  ? APPENDECTOMY    ? APPENDECTOMY    ? 5 years ago?  ? LAPAROSCOPIC APPENDECTOMY N/A 05/22/2016  ? Procedure: APPENDECTOMY LAPAROSCOPIC;  Surgeon: Karen Pert, MD;  Location: [facility] ORS;  Service: General;  Laterality: N/A;  ? ? ?Current Outpatient Medications  ?Medication Sig Dispense Refill  ? ondansetron (ZOFRAN-ODT) 4 MG disintegrating tablet Take 4 mg by mouth every 8 (eight) hours as needed.    ?  Prenatal Vit-Fe Fumarate-FA (MULTIVITAMIN-PRENATAL) 27-0.8 MG TABS tablet Take 1 tablet by mouth daily at 12 noon.    ? promethazine (PHENERGAN) 25 MG tablet Take 1 tablet (25 mg total) by mouth every 6 (six) hours as needed for up to 2 days for nausea or vomiting. (Patient not taking: Reported on 08/20/2021) 8 tablet 0  ? ?No current facility-administered medications for this visit.  ? ? ? ?Allergies:   Zantac [ranitidine hcl]  ? ?Social History:  The patient  reports that she has never smoked. She has never used smokeless tobacco. She reports that she does not drink alcohol and does not use drugs.  ? ?Family History:   family history includes Heart attack (age of onset: 47) in her father; Heart disease in her father; Hypotension in her father.  ? ? ?Review of Systems: ?Review of Systems  ?Constitutional: Negative.   ?HENT: Negative.    ?Respiratory: Negative.    ?Cardiovascular:  Positive for palpitations.  ?Gastrointestinal: Negative.   ?Musculoskeletal: Negative.   ?Neurological:  Positive for dizziness.  ?Psychiatric/Behavioral: Negative.    ?All other systems reviewed and are negative. ? ? ?PHYSICAL EXAM: ?VS:  BP 110/64 (BP Location: Right Arm, Patient Position: Sitting, Cuff Size: Normal)   Pulse 94   Ht 5\' 4"  (1.626 m)   Wt 154 lb 4 oz (70 kg)   LMP 01/25/2021 (Exact Date)   SpO2 98%   BMI 26.48 kg/m?  , BMI Body mass index is 26.48 kg/m?. ?GEN: Well nourished, well developed, in no acute distress ?HEENT:  normal ?Neck: no JVD, carotid bruits, or masses ?Cardiac: RRR; no murmurs, rubs, or gallops,no edema  ?Respiratory:  clear to auscultation bilaterally, normal work of breathing ?GI: soft, nontender, nondistended, + BS ?MS: no deformity or atrophy ?Skin: warm and dry, no rash ?Neuro:  Strength and sensation are intact ?Psych: euthymic mood, full affect ? ? ?Recent Labs: ?07/06/2021: ALT 9; BUN 8; Creatinine, Ser 0.37; Potassium 3.9; Sodium 135 ?07/29/2021: Hemoglobin 11.2; Platelets 369  ? ? ?Lipid  Panel ?No results found for: CHOL, HDL, LDLCALC, TRIG ?  ? ?Wt Readings from Last 3 Encounters:  ?08/20/21 154 lb 4 oz (70 kg)  ?08/19/21 153 lb (69.4 kg)  ?08/05/21 151 lb 9.6 oz (68.8 kg)  ?  ? ? ? ?ASSESSMENT AND PLAN: ? ?Problem List Items Addressed This Visit   ? ? Supervision of normal pregnancy  ? ?Other Visit Diagnoses   ? ? Paroxysmal tachycardia ([place])    -  Primary  ? Relevant Orders  ? EKG 12-Lead  ? Palpitations      ? Relevant Orders  ? EKG 12-Lead  ? ?  ? ?Paroxysmal tachycardia ?Suspect sinus tachycardia in the setting of low blood pressure ?Recommended aggressive hydration, ?Okay to increase salt intake modestly ?Increase fluids first thing in the morning ?For hypotension/orthostasis symptoms, consider compression hose, ?Would stand from a supine or sitting position more slowly to avoid orthostasis ?-Types of EKG monitoring system discussed including Zio monitor, cardia mobile, using her Apple Watch ?For now she will use the Apple Watch, consider a cardia mobile device ?If tracking significant abnormal tachycardia, recommend she call us for a ZIO monitor ?Unable to exclude APCs and PVCs, other types of arrhythmia discussed ?Discussed types of medications that could be used such as beta-blockers we will try to avoid beta-blockers at this time given mild to moderate symptoms, paroxysmal nature, and effort to avoid any fetal complications ? ? ?Normal EKG and clinical exam, ?No strong indication for echocardiogram at this time ? ? ? Total encounter time more than 60 minutes ? Greater than 50% was spent in counseling and coordination of care with the patient ? ?Patient seen in consultation for Karen Wilcox I will be referred back to her office for ongoing care of the issues detailed above ? ?Signed, ?Karen Wilcox, M.D., Ph.D. ?[facility] [place], [place] ?[phone] ?

## 2021-08-30 ENCOUNTER — Encounter: Payer: Self-pay | Admitting: Family Medicine

## 2021-08-30 ENCOUNTER — Other Ambulatory Visit: Payer: Self-pay

## 2021-08-30 ENCOUNTER — Ambulatory Visit (INDEPENDENT_AMBULATORY_CARE_PROVIDER_SITE_OTHER): Payer: 59 | Admitting: Family Medicine

## 2021-08-30 ENCOUNTER — Encounter: Payer: Self-pay | Admitting: Advanced Practice Midwife

## 2021-08-30 VITALS — BP 112/79 | HR 118 | Wt 153.0 lb

## 2021-08-30 DIAGNOSIS — O368120 Decreased fetal movements, second trimester, not applicable or unspecified: Secondary | ICD-10-CM

## 2021-08-30 DIAGNOSIS — Z3480 Encounter for supervision of other normal pregnancy, unspecified trimester: Secondary | ICD-10-CM

## 2021-08-30 NOTE — Progress Notes (Signed)
? ?  PRENATAL VISIT NOTE ? ?Subjective:  ?Karen Wilcox is a 31 y.o. G2P1001 at [redacted]w[redacted]d being seen today for ongoing prenatal care.  She is currently monitored for the following issues for this low-risk pregnancy and has Supervision of other normal pregnancy, antepartum and Decreased fetal movement on their problem list. ? ?Patient reports no complaints.  Contractions: Not present. Vag. Bleeding: None.  Movement: Present. Denies leaking of fluid.  ? ?The following portions of the patient's history were reviewed and updated as appropriate: allergies, current medications, past family history, past medical history, past social history, past surgical history and problem list.  ? ?Objective:  ? ?Vitals:  ? 08/30/21 1528  ?BP: 112/79  ?Pulse: (!) 118  ?Weight: 153 lb (69.4 kg)  ? ? ?Fetal Status: Fetal Heart Rate (bpm): 126   Movement: Present    ? ?General:  Alert, oriented and cooperative. Patient is in no acute distress.  ?Skin: Skin is warm and dry. No rash noted.   ?Cardiovascular: Normal heart rate noted  ?Respiratory: Normal respiratory effort, no problems with respiration noted  ?Abdomen: Soft, gravid, appropriate for gestational age.  Pain/Pressure: Absent     ?Pelvic: Cervical exam deferred        ?Extremities: Normal range of motion.  Edema: None  ?Mental Status: Normal mood and affect. Normal behavior. Normal judgment and thought content.  ? ?Assessment and Plan:  ?Pregnancy: G2P1001 at [redacted]w[redacted]d ? ?1. Decreased fetal movements in second trimester, single or unspecified fetus ?Normal movement today ? ?2. Supervision of other normal pregnancy, antepartum ?Up to date ?Desires NFP ?Does not have peds yet but is getting a name soon ?[facility] visitor [facility] ? ? ?Preterm labor symptoms and general obstetric precautions including but not limited to vaginal bleeding, contractions, leaking of fluid and fetal movement were reviewed in detail with the patient. ?Please refer to After Visit Summary for other  counseling recommendations.  ? ?Return in about 2 weeks (around 09/13/2021) for Routine prenatal care, MD or APP. ? ?Future Appointments  ?Date Time Provider Department Center  ?09/16/2021 10:15 AM Calvert Cantor, CNM [facility]  ?09/30/2021  9:55 AM Calvert Cantor, CNM [facility]  ?10/14/2021  9:35 AM Calvert Cantor, CNM [facility]  ?10/18/2021  1:30 PM Federico Flake, MD [facility]  ?10/28/2021  2:30 PM Calvert Cantor, CNM [facility]  ? ? ?Federico Flake, MD ?

## 2021-09-16 ENCOUNTER — Ambulatory Visit (INDEPENDENT_AMBULATORY_CARE_PROVIDER_SITE_OTHER): Payer: 59 | Admitting: Advanced Practice Midwife

## 2021-09-16 VITALS — BP 110/77 | HR 134 | Wt 155.4 lb

## 2021-09-16 DIAGNOSIS — Z3A33 33 weeks gestation of pregnancy: Secondary | ICD-10-CM

## 2021-09-16 DIAGNOSIS — Z3480 Encounter for supervision of other normal pregnancy, unspecified trimester: Secondary | ICD-10-CM

## 2021-09-16 DIAGNOSIS — R000 Tachycardia, unspecified: Secondary | ICD-10-CM

## 2021-09-16 DIAGNOSIS — O99413 Diseases of the circulatory system complicating pregnancy, third trimester: Secondary | ICD-10-CM

## 2021-09-16 LAB — CBC
Hematocrit: 33.5 % — ABNORMAL LOW (ref 34.0–46.6)
Hemoglobin: 10.4 g/dL — ABNORMAL LOW (ref 11.1–15.9)
MCH: 24.8 pg — ABNORMAL LOW (ref 26.6–33.0)
MCHC: 31 g/dL — ABNORMAL LOW (ref 31.5–35.7)
MCV: 80 fL (ref 79–97)
Platelets: 462 10*3/uL — ABNORMAL HIGH (ref 150–450)
RBC: 4.19 x10E6/uL (ref 3.77–5.28)
RDW: 13.5 % (ref 11.7–15.4)
WBC: 10.4 10*3/uL (ref 3.4–10.8)

## 2021-09-16 NOTE — Progress Notes (Signed)
? ?  PRENATAL VISIT NOTE ? ?Subjective:  ?Karen Wilcox is a 31 y.o. G2P1001 at [redacted]w[redacted]d being seen today for ongoing prenatal care.  She is currently monitored for the following issues for this low-risk pregnancy and has Supervision of other normal pregnancy, antepartum and Decreased fetal movement on their problem list. ? ?Patient reports worsening fatigue and states she is chewing ice "all the time". She endorses family history of anemia and is aware craving ice can be a symptom. She is concerned she is not gaining enough weight. During her previous pregnancy she was told she was underweight and prescribed daily Ensure shakes to facilitate weight gain. Contractions: Not present. Vag. Bleeding: None.  Movement: Present. Denies leaking of fluid.  ? ?The following portions of the patient's history were reviewed and updated as appropriate: allergies, current medications, past family history, past medical history, past social history, past surgical history and problem list. Problem list updated. ? ?Objective:  ? ?Vitals:  ? 09/16/21 1013  ?BP: 110/77  ?Pulse: (!) 134  ?Weight: 155 lb 6.4 oz (70.5 kg)  ? ? ?Fetal Status: Fetal Heart Rate (bpm): 135 Fundal Height: 31 cm Movement: Present    ? ?General:  Alert, oriented and cooperative. Patient is in no acute distress.  ?Skin: Skin is warm and dry. No rash noted.   ?Cardiovascular: Normal heart rate noted  ?Respiratory: Normal respiratory effort, no problems with respiration noted  ?Abdomen: Soft, gravid, appropriate for gestational age.  Pain/Pressure: Present     ?Pelvic: Cervical exam deferred        ?Extremities: Normal range of motion.  Edema: Trace  ?Mental Status: Normal mood and affect. Normal behavior. Normal judgment and thought content.  ? ?Assessment and Plan:  ?Pregnancy: G2P1001 at [redacted]w[redacted]d ? ?1. Supervision of other normal pregnancy, antepartum ?- Reassurance provided re: weight gain ?- Fundal height appropriate for GA, TWG 14 lbs ?- Can perform growth scan  if size<date discrepancy but not indicated at this time ? ?2. Tachycardia ?- Hgb previously 11.2 on 07/29/2021 ?- Discussed fatigue, craving ice and tachycardia as possibly tied to anemia, dehydration ?--increase PO fluids, monitor symptoms  ?- CBC ? ?3. [redacted] weeks gestation of pregnancy ? ? ?Preterm labor symptoms and general obstetric precautions including but not limited to vaginal bleeding, contractions, leaking of fluid and fetal movement were reviewed in detail with the patient. ?Please refer to After Visit Summary for other counseling recommendations.  ?Return for Patient already has next appointments scheduled. ? ?Future Appointments  ?Date Time Provider Department Center  ?09/30/2021  9:55 AM Calvert Cantor, CNM [facility]  ?10/14/2021  9:35 AM Calvert Cantor, CNM [facility]  ?10/18/2021  1:30 PM Federico Flake, MD [facility]  ?10/28/2021  2:30 PM Calvert Cantor, CNM [facility]  ? ? ?Calvert Cantor, CNM ? ?

## 2021-09-20 ENCOUNTER — Encounter: Payer: Self-pay | Admitting: Advanced Practice Midwife

## 2021-09-21 ENCOUNTER — Other Ambulatory Visit: Payer: 59

## 2021-09-21 DIAGNOSIS — O99713 Diseases of the skin and subcutaneous tissue complicating pregnancy, third trimester: Secondary | ICD-10-CM

## 2021-09-22 ENCOUNTER — Encounter: Payer: Self-pay | Admitting: Advanced Practice Midwife

## 2021-09-22 LAB — COMPREHENSIVE METABOLIC PANEL
ALT: 8 IU/L (ref 0–32)
AST: 15 IU/L (ref 0–40)
Albumin/Globulin Ratio: 1.3 (ref 1.2–2.2)
Albumin: 3.8 g/dL (ref 3.8–4.8)
Alkaline Phosphatase: 139 IU/L — ABNORMAL HIGH (ref 44–121)
BUN/Creatinine Ratio: 16 (ref 9–23)
BUN: 7 mg/dL (ref 6–20)
Bilirubin Total: <0.2 mg/dL (ref 0.0–1.2)
CO2: 19 mmol/L — ABNORMAL LOW (ref 20–29)
Calcium: 9.6 mg/dL (ref 8.7–10.2)
Chloride: 103 mmol/L (ref 96–106)
Creatinine, Ser: 0.44 mg/dL — ABNORMAL LOW (ref 0.57–1.00)
Globulin, Total: 3 g/dL (ref 1.5–4.5)
Glucose: 83 mg/dL (ref 70–99)
Potassium: 4.6 mmol/L (ref 3.5–5.2)
Sodium: 138 mmol/L (ref 134–144)
Total Protein: 6.8 g/dL (ref 6.0–8.5)
eGFR: 133 mL/min/{1.73_m2} (ref >59)

## 2021-09-22 LAB — BILE ACIDS, TOTAL: Bile Acids Total: 4.3 umol/L (ref 0.0–10.0)

## 2021-09-30 ENCOUNTER — Encounter: Payer: Self-pay | Admitting: Advanced Practice Midwife

## 2021-09-30 ENCOUNTER — Ambulatory Visit (INDEPENDENT_AMBULATORY_CARE_PROVIDER_SITE_OTHER): Payer: 59 | Admitting: Advanced Practice Midwife

## 2021-09-30 VITALS — BP 116/78 | HR 103 | Wt 158.0 lb

## 2021-09-30 DIAGNOSIS — Z3480 Encounter for supervision of other normal pregnancy, unspecified trimester: Secondary | ICD-10-CM

## 2021-09-30 DIAGNOSIS — L299 Pruritus, unspecified: Secondary | ICD-10-CM

## 2021-09-30 DIAGNOSIS — R000 Tachycardia, unspecified: Secondary | ICD-10-CM

## 2021-09-30 DIAGNOSIS — Z3A35 35 weeks gestation of pregnancy: Secondary | ICD-10-CM

## 2021-09-30 NOTE — Progress Notes (Signed)
ROB [redacted]w[redacted]d 

## 2021-09-30 NOTE — Progress Notes (Signed)
? ?  PRENATAL VISIT NOTE ? ?Subjective:  ?Karen Wilcox is a 31 y.o. G2P1001 at [redacted]w[redacted]d being seen today for ongoing prenatal care.  She is currently monitored for the following issues for this low-risk pregnancy and has Supervision of other normal pregnancy, antepartum and Decreased fetal movement on their problem list. ? ?Patient reports no complaints.  Contractions: Irritability. Vag. Bleeding: None.  Movement: Present. Denies leaking of fluid.  ? ?The following portions of the patient's history were reviewed and updated as appropriate: allergies, current medications, past family history, past medical history, past social history, past surgical history and problem list. Problem list updated. ? ?Objective:  ? ?Vitals:  ? 09/30/21 1003  ?BP: 116/78  ?Pulse: (!) 103  ?Weight: 158 lb (71.7 kg)  ? ? ?Fetal Status: Fetal Heart Rate (bpm): 142 Fundal Height: 34 cm Movement: Present    ? ?General:  Alert, oriented and cooperative. Patient is in no acute distress.  ?Skin: Skin is warm and dry. No rash noted.   ?Cardiovascular: Normal heart rate noted  ?Respiratory: Normal respiratory effort, no problems with respiration noted  ?Abdomen: Soft, gravid, appropriate for gestational age.  Pain/Pressure: Present     ?Pelvic: Cervical exam deferred        ?Extremities: Normal range of motion.  Edema: None  ?Mental Status: Normal mood and affect. Normal behavior. Normal judgment and thought content.  ? ?Assessment and Plan:  ?Pregnancy: G2P1001 at [redacted]w[redacted]d ? ?1. Supervision of other normal pregnancy, antepartum ?- Routine care, for waterbirth ? ?2. Tachycardia ?- S/p Cardiology evaluation, physiology changes in pregnancy ? ?3. Itching ?- Ongoing generalized itching, no palmar itching, bile acids and CMET WNL ? ? ?4. [redacted] weeks gestation of pregnancy ?- GBS and GC next visit ? ?Preterm labor symptoms and general obstetric precautions including but not limited to vaginal bleeding, contractions, leaking of fluid and fetal movement were  reviewed in detail with the patient. ?Please refer to After Visit Summary for other counseling recommendations.  ?Return in about 1 week (around 10/07/2021) for 36 week swabs, MD or APP. ? ?Future Appointments  ?Date Time Provider Department Center  ?10/06/2021  1:30 PM Reva Bores, MD [facility]  ?10/14/2021  9:35 AM Calvert Cantor, CNM [facility]  ?10/18/2021  1:30 PM Federico Flake, MD [facility]  ?10/28/2021  2:30 PM Calvert Cantor, CNM [facility]  ? ? ?Calvert Cantor, CNM ? ?

## 2021-10-06 ENCOUNTER — Other Ambulatory Visit (HOSPITAL_COMMUNITY)
Admission: RE | Admit: 2021-10-06 | Discharge: 2021-10-06 | Disposition: A | Discharge status: Home / Self Care | Payer: 59 | Source: Ambulatory Visit | Attending: Advanced Practice Midwife | Admitting: Advanced Practice Midwife

## 2021-10-06 ENCOUNTER — Ambulatory Visit (INDEPENDENT_AMBULATORY_CARE_PROVIDER_SITE_OTHER): Payer: 59 | Admitting: Family Medicine

## 2021-10-06 DIAGNOSIS — Z3480 Encounter for supervision of other normal pregnancy, unspecified trimester: Secondary | ICD-10-CM | POA: Diagnosis present

## 2021-10-06 DIAGNOSIS — Z3A36 36 weeks gestation of pregnancy: Secondary | ICD-10-CM

## 2021-10-06 NOTE — Progress Notes (Signed)
? ? ?  PRENATAL VISIT NOTE ? ?Subjective:  ?Karen Wilcox is a 31 y.o. G2P1001 at [redacted]w[redacted]d being seen today for ongoing prenatal care.  She is currently monitored for the following issues for this low-risk pregnancy and has Supervision of other normal pregnancy, antepartum and Decreased fetal movement on their problem list. ? ?Patient reports no complaints.  Contractions: Irritability. Vag. Bleeding: None.  Movement: Present. Denies leaking of fluid.  ? ?The following portions of the patient's history were reviewed and updated as appropriate: allergies, current medications, past family history, past medical history, past social history, past surgical history and problem list.  ? ?Objective:  ? ?Vitals:  ? 10/06/21 1331  ?BP: 113/78  ?Pulse: (!) 106  ?Weight: 161 lb (73 kg)  ? ? ?Fetal Status: Fetal Heart Rate (bpm): 152 Fundal Height: 33 cm Movement: Present  Presentation: Vertex ? ?General:  Alert, oriented and cooperative. Patient is in no acute distress.  ?Skin: Skin is warm and dry. No rash noted.   ?Cardiovascular: Normal heart rate noted  ?Respiratory: Normal respiratory effort, no problems with respiration noted  ?Abdomen: Soft, gravid, appropriate for gestational age.  Pain/Pressure: Present     ?Pelvic: Cervical exam performed in the presence of a chaperone Dilation: 2 Effacement (%): 50 Station: -3, -2  ?Extremities: Normal range of motion.  Edema: None  ?Mental Status: Normal mood and affect. Normal behavior. Normal judgment and thought content.  ? ?Assessment and Plan:  ?Pregnancy: G2P1001 at [redacted]w[redacted]d ?1. Supervision of other normal pregnancy, antepartum ?Continue routine prenatal care. ?Cultures today ?Travel precautions ?- Strep Gp B NAA ?- Cervicovaginal ancillary only( ) ? ?Preterm labor symptoms and general obstetric precautions including but not limited to vaginal bleeding, contractions, leaking of fluid and fetal movement were reviewed in detail with the patient. ?Please refer to After  Visit Summary for other counseling recommendations.  ? ?Return in 1 week (on 10/13/2021). ? ?Future Appointments  ?Date Time Provider Department Center  ?10/14/2021  9:35 AM Calvert Cantor, CNM [facility]  ?10/18/2021  1:30 PM Federico Flake, MD [facility]  ?10/28/2021  2:30 PM Calvert Cantor, CNM [facility]  ? ? ?Reva Bores, MD ? ?

## 2021-10-06 NOTE — Patient Instructions (Signed)

## 2021-10-06 NOTE — Progress Notes (Signed)
ROB [redacted]w[redacted]d ? ?CC: Headache onset last night took tylenol, noticed relief.  ? ?Pt wants cervix check. ? ? ? ?

## 2021-10-07 LAB — CERVICOVAGINAL ANCILLARY ONLY
Chlamydia: NEGATIVE
Comment: NEGATIVE
Comment: NORMAL
Neisseria Gonorrhea: NEGATIVE

## 2021-10-08 LAB — STREP GP B NAA: Strep Gp B NAA: NEGATIVE

## 2021-10-11 ENCOUNTER — Encounter: Payer: Self-pay | Admitting: Advanced Practice Midwife

## 2021-10-11 ENCOUNTER — Encounter (HOSPITAL_COMMUNITY): Payer: Self-pay | Admitting: Obstetrics & Gynecology

## 2021-10-11 ENCOUNTER — Other Ambulatory Visit: Payer: Self-pay

## 2021-10-11 ENCOUNTER — Inpatient Hospital Stay (HOSPITAL_COMMUNITY)
Admission: AD | Admit: 2021-10-11 | Discharge: 2021-10-13 | DRG: 807 | Disposition: A | Discharge status: Home / Self Care | Payer: 59 | Attending: Family Medicine | Admitting: Family Medicine

## 2021-10-11 DIAGNOSIS — Z3480 Encounter for supervision of other normal pregnancy, unspecified trimester: Secondary | ICD-10-CM

## 2021-10-11 DIAGNOSIS — O4292 Full-term premature rupture of membranes, unspecified as to length of time between rupture and onset of labor: Secondary | ICD-10-CM | POA: Diagnosis present

## 2021-10-11 DIAGNOSIS — O4202 Full-term premature rupture of membranes, onset of labor within 24 hours of rupture: Secondary | ICD-10-CM | POA: Diagnosis not present

## 2021-10-11 DIAGNOSIS — Z3A37 37 weeks gestation of pregnancy: Secondary | ICD-10-CM

## 2021-10-11 DIAGNOSIS — O26893 Other specified pregnancy related conditions, third trimester: Secondary | ICD-10-CM | POA: Diagnosis present

## 2021-10-11 LAB — CBC
HCT: 31 % — ABNORMAL LOW (ref 36.0–46.0)
Hemoglobin: 9.3 g/dL — ABNORMAL LOW (ref 12.0–15.0)
MCH: 23 pg — ABNORMAL LOW (ref 26.0–34.0)
MCHC: 30 g/dL (ref 30.0–36.0)
MCV: 76.7 fL — ABNORMAL LOW (ref 80.0–100.0)
Platelets: 419 10*3/uL — ABNORMAL HIGH (ref 150–400)
RBC: 4.04 MIL/uL (ref 3.87–5.11)
RDW: 15.3 % (ref 11.5–15.5)
WBC: 9 10*3/uL (ref 4.0–10.5)
nRBC: 0 % (ref 0.0–0.2)

## 2021-10-11 LAB — TYPE AND SCREEN
ABO/RH(D): A POS
Antibody Screen: NEGATIVE

## 2021-10-11 LAB — POCT FERN TEST: POCT Fern Test: POSITIVE

## 2021-10-11 MED ORDER — OXYTOCIN-SODIUM CHLORIDE 30-0.9 UT/500ML-% IV SOLN: 2.5000 [IU]/h | INTRAVENOUS | Status: DC

## 2021-10-11 MED ORDER — LIDOCAINE HCL (PF) 1 % IJ SOLN: 30.0000 mL | INTRAMUSCULAR | Status: DC | PRN

## 2021-10-11 MED ORDER — OXYCODONE-ACETAMINOPHEN 5-325 MG PO TABS: 1.0000 | ORAL_TABLET | ORAL | Status: DC | PRN

## 2021-10-11 MED ORDER — SOD CITRATE-CITRIC ACID 500-334 MG/5ML PO SOLN: 30.0000 mL | ORAL | Status: DC | PRN

## 2021-10-11 MED ORDER — OXYCODONE-ACETAMINOPHEN 5-325 MG PO TABS: 2.0000 | ORAL_TABLET | ORAL | Status: DC | PRN

## 2021-10-11 MED ORDER — ACETAMINOPHEN 325 MG PO TABS
650.0000 mg | ORAL_TABLET | ORAL | Status: DC | PRN
  Administered 2021-10-12: 650 mg via ORAL
  Filled 2021-10-11: qty 2

## 2021-10-11 MED ORDER — ONDANSETRON HCL 4 MG/2ML IJ SOLN: 4.0000 mg | Freq: Four times a day (QID) | INTRAMUSCULAR | Status: DC | PRN

## 2021-10-11 MED ORDER — LACTATED RINGERS IV SOLN: 500.0000 mL | INTRAVENOUS | Status: DC | PRN

## 2021-10-11 MED ORDER — OXYTOCIN BOLUS FROM INFUSION
333.0000 mL | Freq: Once | INTRAVENOUS | Status: AC
  Administered 2021-10-12: 333 mL via INTRAVENOUS

## 2021-10-11 MED ORDER — LACTATED RINGERS IV SOLN: INTRAVENOUS | Status: DC

## 2021-10-11 NOTE — Progress Notes (Signed)
Patient Vitals for the past 4 hrs: ? BP Temp Temp src Pulse  ?10/11/21 2136 120/70 99 ?F (37.2 ?C) Oral 100  ? ?Pt states ctx are sometimes stronger, other times, not so much.  Moving around the room a lot.  FHR dopplers normal and is Cat 1 during EFM.  Ctx irregular.  Pt still prefers expectant mgt, may use breast pump some.  ?

## 2021-10-11 NOTE — Telephone Encounter (Signed)
Called pt to follow up on message about water leaking. Py soaking through pads and when she stands up it runs down her legs. Informed pt that sounds pretty convincing for ROM, and she doesn't need to rush but she can make her way to MAU, pt denies any VB, baby is moving well and feeling some mild contractions. Will inform MAU providers of pt coming in.  ?

## 2021-10-11 NOTE — H&P (Signed)
OBSTETRIC ADMISSION HISTORY AND PHYSICAL ? ?Karen Wilcox is a 31 y.o. female G2P1001 with IUP at [redacted]w[redacted]d by LMP presenting for SROM. Patient reports water broke, leaking clear fluid, around 11:30am today. Patient experiencing irregular contractions and cramping. She reports +FMs, No LOF, no VB, no blurry vision, headaches or peripheral edema, and RUQ pain.  She plans on breast feeding.She received her prenatal care at [facility]. ? ?Dating: By LMP --->  Estimated Date of Delivery: 11/01/21 ? ?Sono:   ? ?@[redacted]w[redacted]d , CWD, normal anatomy, Variable presentation, Fundal placental lie, 216g EFW ? ? ?Prenatal History/Complications: ?Decreased Fetal Movement ([place], [place] on 07/06/21) ? ?Past Medical History: ?Past Medical History:  ?Diagnosis Date  ? Medical history non-contributory   ? ? ?Past Surgical History: ?Past Surgical History:  ?Procedure Laterality Date  ? APPENDECTOMY    ? APPENDECTOMY    ? 5 years ago?  ? LAPAROSCOPIC APPENDECTOMY N/A 05/22/2016  ? Procedure: APPENDECTOMY LAPAROSCOPIC;  Surgeon: Clayburn Pert, MD;  Location: [facility] ORS;  Service: General;  Laterality: N/A;  ? ? ?Obstetrical History: ?OB History   ? ? Gravida  ?2  ? Para  ?1  ? Term  ?1  ? Preterm  ?0  ? AB  ?0  ? Living  ?1  ?  ? ? SAB  ?0  ? IAB  ?0  ? Ectopic  ?0  ? Multiple  ?0  ? Live Births  ?1  ?   ?  ? Obstetric Comments  ?G1 was given for adoption  ?  ? ?  ? ? ?Social History ?Social History  ? ?Socioeconomic History  ? Marital status: Married  ?  Spouse name: Genia Del  ? Number of children: Not on file  ? Years of education: Not on file  ? Highest education level: Not on file  ?Occupational History  ? Not on file  ?Tobacco Use  ? Smoking status: Never  ? Smokeless tobacco: Never  ?Vaping Use  ? Vaping Use: Never used  ?Substance and Sexual Activity  ? Alcohol use: No  ? Drug use: No  ? Sexual activity: Yes  ?  Comment: Stopped OCP's 06/2019  ?Other Topics Concern  ? Not on file  ?Social History Narrative  ? Not on file   ? ?Social Determinants of Health  ? ?Financial Resource Strain: Not on file  ?Food Insecurity: Not on file  ?Transportation Needs: Not on file  ?Physical Activity: Not on file  ?Stress: Not on file  ?Social Connections: Not on file  ? ? ?Family History: ?Family History  ?Problem Relation Age of Onset  ? Heart disease Father   ?     stents placement  ? Heart attack Father 50  ? Hypotension Father   ? ? ?Allergies: ?Allergies  ?Allergen Reactions  ? Zantac [Ranitidine Hcl] Hives  ? ? ?Medications Prior to Admission  ?Medication Sig Dispense Refill Last Dose  ? ondansetron (ZOFRAN-ODT) 4 MG disintegrating tablet Take 4 mg by mouth every 8 (eight) hours as needed.     ? Prenatal Vit-Fe Fumarate-FA (MULTIVITAMIN-PRENATAL) 27-0.8 MG TABS tablet Take 1 tablet by mouth daily at 12 noon.     ? promethazine (PHENERGAN) 25 MG tablet Take 1 tablet (25 mg total) by mouth every 6 (six) hours as needed for up to 2 days for nausea or vomiting. (Patient not taking: Reported on 08/20/2021) 8 tablet 0   ? ? ? ?Review of Systems  ? ?All systems reviewed and negative except as stated in  HPI ? ?Blood pressure 123/75, pulse 100, last menstrual period 01/25/2021. ?General appearance: alert, cooperative, and no distress ?Lungs: normal resp effort ?Heart: regular rate and rhythm ?Abdomen: soft, non-tender, gravid ?Extremities: Homans sign is negative, no sign of DVT ? ?Presentation: Cephalic per [place] in MAU ?Fetal monitoring 130 BPM, moderate variability, accels present, no decels ?Uterine activity Irregular ?Exam by:: Dr. Nehemiah Settle per U/S ? ? ?Prenatal labs: ?ABO, Rh: --/--/PENDING (05/01 1549) ?Antibody: PENDING (05/01 1549) ?Rubella: 1.49 (11/16 1528) ?RPR: Non Reactive (02/16 1447)  ?HBsAg: Negative (11/16 1528)  ?HIV: Non Reactive (02/16 1447)  ?GBS: Negative/-- (04/26 1400)  ?2 hr Glucola Normal ?Genetic screening  MaterniT21 Normal ?Anatomy US Normal ? ?Prenatal Transfer Tool  ?Maternal Diabetes: No ?Genetic Screening: Normal ?Maternal  Ultrasounds/Referrals: Normal ?Fetal Ultrasounds or other Referrals:  None ?Maternal Substance Abuse:  No ?Significant Maternal Medications:  None ?Significant Maternal Lab Results: Group B Strep negative ? ?Results for orders placed or performed during the hospital encounter of 10/11/21 (from the past 24 hour(s))  ?POCT fern test  ? Collection Time: 10/11/21  3:29 PM  ?Result Value Ref Range  ? POCT Fern Test Positive = ruptured amniotic membanes   ?Type and screen [place]  ? Collection Time: 10/11/21  3:49 PM  ?Result Value Ref Range  ? ABO/RH(D) PENDING   ? Antibody Screen PENDING   ? Sample Expiration    ?  10/14/2021,2359 ?Performed at [facility], [place] [address], [place], [place] [address] ?  ? ? ?Patient Active Problem List  ? Diagnosis Date Noted  ? Indication for care or intervention related to labor and delivery 10/11/2021  ? Decreased fetal movement 07/06/2021  ? Supervision of other normal pregnancy, antepartum 04/28/2021  ? ? ?Assessment/Plan:  ?Karen Wilcox is a 30 y.o. female G2P1001 with IUP at [redacted]w[redacted]d by LMP presenting for SROM. ? ?#Labor: Options (augment, expectant mgt) discussed, along w/^ risk of infection w/>18 hrs PROM. Prefers expectant mgt. Cx exam deferred.  FHR Cat 1 ?#Pain: PRN ?#FWB: Category I ?#ID:  GBS neg ?#MOF: Breast ?#MOC:unsure ?#Circ:  Undecided ? ? ?  ?

## 2021-10-11 NOTE — MAU Note (Signed)
.  Karen Wilcox is a 31 y.o. at [redacted]w[redacted]d here in MAU reporting: her water broke between 12:30 ans 1pm today. Clear fluid. C/o ctx and cramping. Good fetal movement reported.  ? ?Onset of complaint: 12:30 ?Pain score: 5-6 ?Vitals:  ? 10/11/21 1520  ?BP: 123/75  ?Pulse: 100  ?   ?FHT:130 ?Lab orders placed from triage:  Maryann Alar ? ?

## 2021-10-12 ENCOUNTER — Inpatient Hospital Stay (HOSPITAL_COMMUNITY): Payer: 59 | Admitting: Anesthesiology

## 2021-10-12 ENCOUNTER — Encounter (HOSPITAL_COMMUNITY): Payer: Self-pay | Admitting: Family Medicine

## 2021-10-12 DIAGNOSIS — O4202 Full-term premature rupture of membranes, onset of labor within 24 hours of rupture: Secondary | ICD-10-CM

## 2021-10-12 DIAGNOSIS — Z3A37 37 weeks gestation of pregnancy: Secondary | ICD-10-CM

## 2021-10-12 LAB — RPR: RPR Ser Ql: NONREACTIVE

## 2021-10-12 MED ORDER — DIPHENHYDRAMINE HCL 25 MG PO CAPS: 25.0000 mg | ORAL_CAPSULE | Freq: Four times a day (QID) | ORAL | Status: DC | PRN

## 2021-10-12 MED ORDER — COCONUT OIL OIL: 1.0000 "application " | TOPICAL_OIL | Status: DC | PRN

## 2021-10-12 MED ORDER — ONDANSETRON HCL 4 MG/2ML IJ SOLN: 4.0000 mg | INTRAMUSCULAR | Status: DC | PRN

## 2021-10-12 MED ORDER — PHENYLEPHRINE 80 MCG/ML (10ML) SYRINGE FOR IV PUSH (FOR BLOOD PRESSURE SUPPORT): 80.0000 ug | PREFILLED_SYRINGE | INTRAVENOUS | Status: DC | PRN

## 2021-10-12 MED ORDER — EPHEDRINE 5 MG/ML INJ: 10.0000 mg | INTRAVENOUS | Status: DC | PRN

## 2021-10-12 MED ORDER — IBUPROFEN 600 MG PO TABS
600.0000 mg | ORAL_TABLET | Freq: Four times a day (QID) | ORAL | Status: DC
  Administered 2021-10-12 – 2021-10-13 (×5): 600 mg via ORAL
  Filled 2021-10-12 (×5): qty 1

## 2021-10-12 MED ORDER — WITCH HAZEL-GLYCERIN EX PADS: 1.0000 "application " | MEDICATED_PAD | CUTANEOUS | Status: DC | PRN

## 2021-10-12 MED ORDER — MISOPROSTOL 50MCG HALF TABLET
50.0000 ug | ORAL_TABLET | ORAL | Status: DC | PRN
  Administered 2021-10-12: 50 ug via BUCCAL
  Filled 2021-10-12: qty 1

## 2021-10-12 MED ORDER — DIBUCAINE (PERIANAL) 1 % EX OINT: 1.0000 "application " | TOPICAL_OINTMENT | CUTANEOUS | Status: DC | PRN

## 2021-10-12 MED ORDER — ACETAMINOPHEN 325 MG PO TABS
650.0000 mg | ORAL_TABLET | ORAL | Status: DC | PRN
  Administered 2021-10-12 – 2021-10-13 (×3): 650 mg via ORAL
  Filled 2021-10-12 (×3): qty 2

## 2021-10-12 MED ORDER — PRENATAL MULTIVITAMIN CH
1.0000 | ORAL_TABLET | Freq: Every day | ORAL | Status: DC
  Administered 2021-10-13: 1 via ORAL
  Filled 2021-10-12: qty 1

## 2021-10-12 MED ORDER — TERBUTALINE SULFATE 1 MG/ML IJ SOLN: 0.2500 mg | Freq: Once | INTRAMUSCULAR | Status: DC | PRN

## 2021-10-12 MED ORDER — METHYLERGONOVINE MALEATE 0.2 MG/ML IJ SOLN: 0.2000 mg | INTRAMUSCULAR | Status: DC | PRN

## 2021-10-12 MED ORDER — DIPHENHYDRAMINE HCL 50 MG/ML IJ SOLN: 12.5000 mg | INTRAMUSCULAR | Status: DC | PRN

## 2021-10-12 MED ORDER — SENNOSIDES-DOCUSATE SODIUM 8.6-50 MG PO TABS
2.0000 | ORAL_TABLET | Freq: Every day | ORAL | Status: DC
  Administered 2021-10-13: 2 via ORAL
  Filled 2021-10-12: qty 2

## 2021-10-12 MED ORDER — ONDANSETRON HCL 4 MG PO TABS: 4.0000 mg | ORAL_TABLET | ORAL | Status: DC | PRN

## 2021-10-12 MED ORDER — SIMETHICONE 80 MG PO CHEW: 80.0000 mg | CHEWABLE_TABLET | ORAL | Status: DC | PRN

## 2021-10-12 MED ORDER — LACTATED RINGERS IV SOLN: 500.0000 mL | Freq: Once | INTRAVENOUS | Status: DC

## 2021-10-12 MED ORDER — FENTANYL-BUPIVACAINE-NACL 0.5-0.125-0.9 MG/250ML-% EP SOLN: 12.0000 mL/h | EPIDURAL | Status: DC | PRN

## 2021-10-12 MED ORDER — TERBUTALINE SULFATE 1 MG/ML IJ SOLN
0.2500 mg | Freq: Once | INTRAMUSCULAR | Status: DC | PRN
Start: 2021-10-12 — End: 2021-10-12

## 2021-10-12 MED ORDER — BENZOCAINE-MENTHOL 20-0.5 % EX AERO: 1.0000 "application " | INHALATION_SPRAY | CUTANEOUS | Status: DC | PRN

## 2021-10-12 MED ORDER — MEASLES, MUMPS & RUBELLA VAC IJ SOLR: 0.5000 mL | Freq: Once | INTRAMUSCULAR | Status: DC

## 2021-10-12 MED ORDER — DOCUSATE SODIUM 100 MG PO CAPS
100.0000 mg | ORAL_CAPSULE | Freq: Two times a day (BID) | ORAL | Status: DC
  Administered 2021-10-13: 100 mg via ORAL
  Filled 2021-10-12: qty 1

## 2021-10-12 MED ORDER — METHYLERGONOVINE MALEATE 0.2 MG PO TABS: 0.2000 mg | ORAL_TABLET | ORAL | Status: DC | PRN

## 2021-10-12 MED ORDER — MEDROXYPROGESTERONE ACETATE 150 MG/ML IM SUSP: 150.0000 mg | INTRAMUSCULAR | Status: DC | PRN

## 2021-10-12 MED ORDER — FENTANYL-BUPIVACAINE-NACL 0.5-0.125-0.9 MG/250ML-% EP SOLN
12.0000 mL/h | EPIDURAL | Status: DC | PRN
  Administered 2021-10-12: 12 mL/h via EPIDURAL
  Filled 2021-10-12: qty 250

## 2021-10-12 MED ORDER — TETANUS-DIPHTH-ACELL PERTUSSIS 5-2.5-18.5 LF-MCG/0.5 IM SUSY: 0.5000 mL | PREFILLED_SYRINGE | Freq: Once | INTRAMUSCULAR | Status: DC

## 2021-10-12 MED ORDER — LIDOCAINE HCL (PF) 1 % IJ SOLN
INTRAMUSCULAR | Status: DC | PRN
  Administered 2021-10-12: 8 mL via EPIDURAL

## 2021-10-12 MED ORDER — OXYTOCIN-SODIUM CHLORIDE 30-0.9 UT/500ML-% IV SOLN
1.0000 m[IU]/min | INTRAVENOUS | Status: DC
  Administered 2021-10-12: 2 m[IU]/min via INTRAVENOUS
  Filled 2021-10-12: qty 500

## 2021-10-12 MED ORDER — FERROUS SULFATE 325 (65 FE) MG PO TABS
325.0000 mg | ORAL_TABLET | ORAL | Status: DC
  Administered 2021-10-13: 325 mg via ORAL
  Filled 2021-10-12: qty 1

## 2021-10-12 NOTE — Discharge Summary (Signed)
? ?  Postpartum Discharge Summary ? ? ?Patient Name: Karen Wilcox ?DOB: 1991/05/17 ?MRN: [id number] ? ?Date of admission: 10/11/2021 ?Delivery date:10/12/2021  ?Delivering provider: AGBAZA, ONOME S  ?Date of discharge: 10/13/2021 ? ?Admitting diagnosis: Indication for care or intervention related to labor and delivery [O75.9] ?Intrauterine pregnancy: [redacted]w[redacted]d    ?Secondary diagnosis:  Principal Problem: ?  SVD (spontaneous vaginal delivery) ?Active Problems: ?  Supervision of other normal pregnancy, antepartum ? ?Additional problems: None    ?Discharge diagnosis: Term Pregnancy Delivered                                              ?Post partum procedures:  None ?Augmentation: Pitocin and Cytotec ?Complications: None ? ?Hospital course: Onset of Labor With Vaginal Delivery      ?31y.o. yo G2P1001 at 391w1das admitted in Latent Labor on 10/11/2021 after PROM. Patient had an uncomplicated labor course and progressed to complete after Cytotec and Pitocin. She has a normal vaginal delivery.  ?Membrane Rupture Time/Date: 12:30 PM ,10/11/2021   ?Delivery Method:Vaginal, Spontaneous  ?Episiotomy: None  ?Lacerations:  1st degree;Perineal  ?Patient had an uncomplicated postpartum course.  Patient did briefly have elevated temperatures at 1758 and 1915 on PPD#0, a few hours after delivery.  This resolved without intervention.  She remains afebrile this morning without abdominal tenderness.  Her bleeding is minimal.  She is breastfeeding well.  She is ambulating, tolerating a regular diet, passing flatus, and urinating well.  Strict return precautions reviewed should patient develop worsening pain or a fever after discharge.  Patient voiced understanding.  Patient is discharged home in stable condition on 10/13/21. ? ?Newborn Data: ?Birth date:10/12/2021  ?Birth time:4:13 PM  ?Gender:Female  ?Living status:Living  ?Apgars:9 ,9  ?Weight:2900 g  ? ?Magnesium Sulfate received: No ?BMZ received: No ?Rhophylac: N/A ?MMR: N/A ?T-DaP: Given  prenatally ?Flu: N/A ?Transfusion: No ? ?Physical exam  ?Vitals:  ? 10/12/21 1758 10/12/21 1915 10/13/21 0000 10/13/21 0225  ?BP: 112/80 (!) 97/57 101/63   ?Pulse: (!) 117 (!) 108 (!) 101   ?Resp: _0 ?Temp: 100.3 ?F (37.9 ?C) (!) 100.6 ?F (38.1 ?C) 99.5 ?F (37.5 ?C) 98.5 ?F (36.9 ?C)  ?TempSrc: Oral Oral Axillary Axillary  ?SpO2: 100% 100% 99%   ?Weight:      ?Height:      ? ?General: alert, cooperative, and no distress ?Lochia: appropriate ?Uterine Fundus: firm and below umbilicus, nontender to palpation  ?DVT Evaluation: no LE edema or calf tenderness to palpation  ? ?Labs: ?Lab Results  ?Component Value Date  ? WBC 18.1 (H) 10/13/2021  ? HGB 9.0 (L) 10/13/2021  ? HCT 28.8 (L) 10/13/2021  ? MCV 74.6 (L) 10/13/2021  ? PLT 358 10/13/2021  ? ? ?  Latest Ref Rng & Units 09/21/2021  ?  2:21 PM  ?CMP  ?Glucose 70 - 99 mg/dL 83    ?BUN 6 - 20 mg/dL 7    ?Creatinine 0.57 - 1.00 mg/dL 0.44    ?Sodium 134 - 144 mmol/L 138    ?Potassium 3.5 - 5.2 mmol/L 4.6    ?Chloride 96 - 106 mmol/L 103    ?CO2 20 - 29 mmol/L 19    ?Calcium 8.7 - 10.2 mg/dL 9.6    ?Total Protein 6.0 - 8.5 g/dL 6.8    ?Total Bilirubin 0.0 - 1.2 mg/dL <  0.2    ?Alkaline Phos 44 - 121 IU/L 139    ?AST 0 - 40 IU/L 15    ?ALT 0 - 32 IU/L 8    ? ?Edinburgh Score: ?   ? View : No data to display.  ?  ?  ?  ? ? ? ?After visit meds:  ?Allergies as of 10/13/2021   ? ?   Reactions  ? Zantac [ranitidine Hcl] Hives  ? ?  ? ?  ?Medication List  ?  ? ?STOP taking these medications   ? ?ondansetron 4 MG disintegrating tablet ?Commonly known as: ZOFRAN-ODT ?  ? ?  ? ?TAKE these medications   ? ?acetaminophen 500 MG tablet ?Commonly known as: TYLENOL ?Take 2 tablets (1,000 mg total) by mouth every 8 (eight) hours as needed (pain). ?What changed:  ?how much to take ?when to take this ?reasons to take this ?  ?calcium carbonate 500 MG chewable tablet ?Commonly known as: TUMS - dosed in mg elemental calcium ?Chew 1,000 mg by mouth daily as needed for indigestion or  heartburn. ?  ?ferrous sulfate 325 (65 FE) MG tablet ?Take 325 mg by mouth every other day. ?  ?ibuprofen 600 MG tablet ?Commonly known as: ADVIL ?Take 1 tablet (600 mg total) by mouth every 6 (six) hours as needed (pain). ?  ?multivitamin-prenatal 27-0.8 MG Tabs tablet ?Take 1 tablet by mouth daily at 12 noon. ?  ? ?  ? ? ? ?Discharge home in stable condition ?Infant Feeding: Breast ?Infant Disposition: Anticipate home with mother  ?Discharge instruction: per After Visit Summary and Postpartum booklet. ?Activity: Advance as tolerated. Pelvic rest for 6 weeks.  ?Diet: routine diet ?Future Appointments: ?Future Appointments  ?Date Time Provider Dennis  ?11/22/2021  3:50 PM Caren Macadam, MD [facility]  ? ?Follow up Visit: ? Follow-up Information   ? ? [place]             . Go on 11/22/2021.   ?Why: at 3:50 pm with Lauretta Chester ?Contact information: ?[place] ? ?  ?  ? ?  ?  ? ?  ? ?Message sent to [facility] on 10/12/21 ? ?Please schedule this patient for a In person postpartum visit in 6 weeks with the following provider: Any provider. ?Additional Postpartum F/U:  None   ?Low risk pregnancy complicated by:  None ?Delivery mode:  Vaginal, Spontaneous  ?Anticipated Birth Control:  NFP ? ?10/13/2021 ?Genia Del, MD ? ? ? ?

## 2021-10-12 NOTE — Progress Notes (Signed)
Karen Wilcox is a 31 y.o. G2P1001 at [redacted]w[redacted]d admitted for rupture of membranes ? ?Subjective: ?Patient uncomfortable in tub. She is requesting an epidural at this time.  ? ?Objective: ?BP 105/61   Pulse 98   Temp 99.1 ?F (37.3 ?C) (Oral)   Resp 16   Ht 5\' 4"  (1.626 m)   Wt 73 kg   LMP 01/25/2021 (Exact Date)   BMI 27.62 kg/m?  ?No intake/output data recorded. ?No intake/output data recorded. ? ?FHT: 135 bpm via IA  ?UC:  Q 3-4 by palpation ?SVE:   Dilation: 5 ?Effacement (%): 70 ?Station: -2 ?Exam by:: [email or web address], RNC ? ?Labs: ?Lab Results  ?Component Value Date  ? WBC 9.0 10/11/2021  ? HGB 9.3 (L) 10/11/2021  ? HCT 31.0 (L) 10/11/2021  ? MCV 76.7 (L) 10/11/2021  ? PLT 419 (H) 10/11/2021  ? ? ?Assessment / Plan: ?Karen Wilcox is a 31 y.o. G2P1 at [redacted]w[redacted]d admitted for PROM ? ?Labor: Progressing. Requesting epidural ?Fetal Wellbeing:  FHT's reassuring via doppler ?Pain Control:  Epidural ?I/D:   GBS neg ?Anticipated MOD:  NSVD ? ? ?[redacted]w[redacted]d, CNM ?10/12/2021, 2:45 PM ? ? ?

## 2021-10-12 NOTE — Lactation Note (Signed)
This note was copied from a baby's chart. ?Lactation Consultation Note ? ?Patient Name: Boy Corinne Goucher ?Today's Date: 10/12/2021 ?  ?Age:31 hours ?LC attempted to see Mom, in with the nurse at the time. Mom stated attempted a latch infant sleepy.  ?LC not able to do initial visit.  ? ?Maternal Data ?  ? ?Feeding ?  ? ?LATCH Score ?Latch: Grasps breast easily, tongue down, lips flanged, rhythmical sucking. ? ?Audible Swallowing: A few with stimulation ? ?Type of Nipple: Everted at rest and after stimulation ? ?Comfort (Breast/Nipple): Soft / non-tender ? ?Hold (Positioning): Full assist, staff holds infant at breast ? ?LATCH Score: 7 ? ? ?Lactation Tools Discussed/Used ?  ? ?Interventions ?  ? ?Discharge ?  ? ?Consult Status ?  ? ? ? ?Torah Pinnock  Nicholson-Springer ?10/12/2021, 7:14 PM ? ? ? ?

## 2021-10-12 NOTE — Anesthesia Preprocedure Evaluation (Signed)
Anesthesia Evaluation  ?Patient identified by MRN, date of birth, ID band ?Patient awake ? ? ? ?Reviewed: ?Allergy & Precautions, H&P , NPO status , Patient's Chart, lab work & pertinent test results, reviewed documented beta blocker date and time  ? ?Airway ?Mallampati: I ? ?TM Distance: >3 FB ?Neck ROM: full ? ? ? Dental ?no notable dental hx. ?(+) Teeth Intact, Dental Advisory Given ?  ?Pulmonary ?neg pulmonary ROS,  ?  ?Pulmonary exam normal ?breath sounds clear to auscultation ? ? ? ? ? ? Cardiovascular ?negative cardio ROS ?Normal cardiovascular exam ?Rhythm:regular Rate:Normal ? ? ?  ?Neuro/Psych ?negative neurological ROS ? negative psych ROS  ? GI/Hepatic ?negative GI ROS, Neg liver ROS,   ?Endo/Other  ?negative endocrine ROS ? Renal/GU ?negative Renal ROS  ?negative genitourinary ?  ?Musculoskeletal ? ? Abdominal ?  ?Peds ? Hematology ?negative hematology ROS ?(+)   ?Anesthesia Other Findings ? ? Reproductive/Obstetrics ?(+) Pregnancy ? ?  ? ? ? ? ? ? ? ? ? ? ? ? ? ?  ?  ? ? ? ? ? ? ? ? ?Anesthesia Physical ?Anesthesia Plan ? ?ASA: 2 ? ?Anesthesia Plan: Epidural  ? ?Post-op Pain Management: Minimal or no pain anticipated  ? ?Induction:  ? ?PONV Risk Score and Plan: 2 and Treatment may vary due to age or medical condition ? ?Airway Management Planned: Natural Airway ? ?Additional Equipment:  ? ?Intra-op Plan:  ? ?Post-operative Plan:  ? ?Informed Consent: I have reviewed the patients History and Physical, chart, labs and discussed the procedure including the risks, benefits and alternatives for the proposed anesthesia with the patient or authorized representative who has indicated his/her understanding and acceptance.  ? ? ? ?Dental Advisory Given ? ?Plan Discussed with: Anesthesiologist ? ?Anesthesia Plan Comments: (Labs checked- platelets confirmed with RN in room. Fetal heart tracing, per RN, reported to be stable enough for sitting procedure. ?Discussed epidural, and  patient consents to the procedure:  included risk of possible headache,backache, failed block, allergic reaction, and nerve injury. This patient was asked if she had any questions or concerns before the procedure started.)  ? ? ? ? ? ? ?Anesthesia Quick Evaluation ? ?

## 2021-10-12 NOTE — Progress Notes (Addendum)
LABOR PROGRESS NOTE ? ?Subjective: Patient is comfortable at bs in no discomfort a this time. Does not report any abnormal s/s at this time. Reports feeling some discouragement with slow progress but flexible with POC for management.  ? ?Objective: ?Blood pressure (!) 95/48, pulse 86, temperature 99.1 ?F (37.3 ?C), temperature source Oral, resp. rate 14, height 5\' 4"  (1.626 m), weight 73 kg, last menstrual period 01/25/2021. ?Temp (24hrs), Avg:98.7 ?F (37.1 ?C), Min:98.1 ?F (36.7 ?C), Max:99.1 ?F (37.3 ?C) ? ? ?Fetal Heart Tones ?Overall cat 1; on monitor at this time with 135 baseline, 15x15 accels present, no decels at this time. Variability is moderate ? ?Contractions: 3 in 10 min ? ?Cervical Exam: not indicated at this time  ? ?ASSESSMENT AND PLAN:   ?IUP at [redacted]w[redacted]d in early labor pattern ? ?Beside [redacted]w[redacted]d performed by [place] to confirm vertex presentation ?Plan to initiate pitocin 2x2 as tolerated until active labor. D/c once active to allow for hydrotherapy labor in waterbirth tub.  ?Recheck PRN ? ?Discussed IAI with patient d/t prolonged rupture. Plan to decrease cervical exams and monitor for s/s of infection.  ? ?Anticipate SVD  ? ?Electronically Signed By: ?Lenoria Farrier, SNM ?10/12/21 12:22 PM   ? ?Attestation of CNM Supervision of Midwife Student: Evaluation and management procedures were performed by the midwife student under my supervision. I was immediately available for direct supervision, assistance and direction throughout this encounter.  I also confirm that I have verified the information documented in the resident?s note, and that I have also personally reperformed the pertinent components of the physical exam and all of the medical decision making activities.  I have also made any necessary editorial changes. ? ?Discussed second dose of cytotec vs starting low dose Pit. Patient desires low dose pit. Will d/c once in active labor to allow for patient to labor in tub.  ? ? ?12/12/21,  CNM ?10/12/2021 12:28 PM ? ? ?

## 2021-10-12 NOTE — Anesthesia Procedure Notes (Signed)
Epidural ?Patient location during procedure: OB ?Start time: 10/12/2021 2:46 PM ?End time: 10/12/2021 2:50 PM ? ?Staffing ?Anesthesiologist: Bethena Midget, MD ? ?Preanesthetic Checklist ?Completed: patient identified, IV checked, site marked, risks and benefits discussed, surgical consent, monitors and equipment checked, pre-op evaluation and timeout performed ? ?Epidural ?Patient position: sitting ?Prep: DuraPrep and site prepped and draped ?Patient monitoring: continuous pulse ox and blood pressure ?Approach: midline ?Location: L3-L4 ?Injection technique: LOR air ? ?Needle:  ?Needle type: Tuohy  ?Needle gauge: 17 G ?Needle length: 9 cm and 9 ?Needle insertion depth: 6 cm ?Catheter type: closed end flexible ?Catheter size: 19 Gauge ?Catheter at skin depth: 12 cm ?Test dose: negative ? ?Assessment ?Events: blood not aspirated, injection not painful, no injection resistance, no paresthesia and negative IV test ? ? ? ?

## 2021-10-12 NOTE — Progress Notes (Signed)
Patient Vitals for the past 4 hrs: ? BP Temp Temp src Pulse Resp  ?10/12/21 0729 122/68 98.4 ?F (36.9 ?C) Oral 82 16  ? ?Pt didn't do much in the way of contracting last night.  Ready for augmentation.  Cx 3-4/50/-2.  FHR Cat 1.  Will try a buccal cytotec. ?

## 2021-10-12 NOTE — Lactation Note (Signed)
This note was copied from a baby's chart. ?Lactation Consultation Note ? ?Patient Name: Karen Wilcox ?Today's Date: 10/12/2021 ?  ?Age:31 hours ?Per RN, mom will call when ready for next latch assistance at 0100 pm.  ?Maternal Data ?  ? ?Feeding ?  ? ?LATCH Score ?Latch: Too sleepy or reluctant, no latch achieved, no sucking elicited. ? ?  ? ?  ? ?  ? ?  ? ?  ? ? ?Lactation Tools Discussed/Used ?  ? ?Interventions ?  ? ?Discharge ?  ? ?Consult Status ?  ? ? ? ?Danelle Earthly ?10/12/2021, 11:44 PM ? ? ? ?

## 2021-10-13 LAB — CBC WITH DIFFERENTIAL/PLATELET
Abs Immature Granulocytes: 0.1 10*3/uL — ABNORMAL HIGH (ref 0.00–0.07)
Basophils Absolute: 0 10*3/uL (ref 0.0–0.1)
Basophils Relative: 0 %
Eosinophils Absolute: 0.1 10*3/uL (ref 0.0–0.5)
Eosinophils Relative: 0 %
HCT: 28.8 % — ABNORMAL LOW (ref 36.0–46.0)
Hemoglobin: 9 g/dL — ABNORMAL LOW (ref 12.0–15.0)
Immature Granulocytes: 1 %
Lymphocytes Relative: 7 %
Lymphs Abs: 1.3 10*3/uL (ref 0.7–4.0)
MCH: 23.3 pg — ABNORMAL LOW (ref 26.0–34.0)
MCHC: 31.3 g/dL (ref 30.0–36.0)
MCV: 74.6 fL — ABNORMAL LOW (ref 80.0–100.0)
Monocytes Absolute: 1.2 10*3/uL — ABNORMAL HIGH (ref 0.1–1.0)
Monocytes Relative: 7 %
Neutro Abs: 15.4 10*3/uL — ABNORMAL HIGH (ref 1.7–7.7)
Neutrophils Relative %: 85 %
Platelets: 358 10*3/uL (ref 150–400)
RBC: 3.86 MIL/uL — ABNORMAL LOW (ref 3.87–5.11)
RDW: 15.6 % — ABNORMAL HIGH (ref 11.5–15.5)
WBC: 18.1 10*3/uL — ABNORMAL HIGH (ref 4.0–10.5)
nRBC: 0 % (ref 0.0–0.2)

## 2021-10-13 MED ORDER — IBUPROFEN 600 MG PO TABS: 600.0000 mg | ORAL_TABLET | Freq: Four times a day (QID) | ORAL | 0 refills | Status: DC | PRN

## 2021-10-13 MED ORDER — ACETAMINOPHEN 500 MG PO TABS: 1000.0000 mg | ORAL_TABLET | Freq: Three times a day (TID) | ORAL | 0 refills | Status: DC | PRN

## 2021-10-13 NOTE — Anesthesia Postprocedure Evaluation (Signed)
Anesthesia Post Note ? ?Patient: Karen Wilcox ? ?Procedure(s) Performed: AN AD HOC LABOR EPIDURAL ? ?  ? ?Patient location during evaluation: Mother Baby ?Anesthesia Type: Epidural ?Level of consciousness: awake and alert ?Pain management: pain level controlled ?Vital Signs Assessment: post-procedure vital signs reviewed and stable ?Respiratory status: spontaneous breathing, nonlabored ventilation and respiratory function stable ?Cardiovascular status: stable ?Postop Assessment: no headache, no backache and epidural receding ?Anesthetic complications: no ? ? ?No notable events documented. ? ?Last Vitals:  ?Vitals:  ? 10/13/21 0000 10/13/21 0225  ?BP: 101/63   ?Pulse: (!) 101   ?Resp: 18   ?Temp: 37.5 ?C 36.9 ?C  ?SpO2: 99%   ?  ?Last Pain:  ?Vitals:  ? 10/13/21 0425  ?TempSrc:   ?PainSc: 4   ? ? ?  ?  ?  ?  ?  ?  ? ?[place] Droll ? ? ? ? ?

## 2021-10-13 NOTE — Lactation Note (Signed)
This note was copied from a baby's chart. ?Lactation Consultation Note ? ?Patient Name: Karen Wilcox ?Today's Date: 10/13/2021 ?Reason for consult: Initial assessment;1st time breastfeeding;Early term 37-38.6wks ?Age:31 hours ? ? ?P1 mother whose infant is now 70 hours old.  This is an early term infant at 37+1 weeks.  Mother's current feeding preference is breast. ? ?Baby "Maisie Fus" was asleep STS on mother's chest when I arrived.  Mother has latched a few times since delivery; no pain noted.  Encouraged to feed at least every three hours or sooner due to gestational age.  Mother has been able to express colostrum and recently spoon fed 2 mls to baby.  Offered to return for latch assistance as desired. ? ?Breast feeding basics reviewed.  Mother had no questions/concerns at this time.  No support person present. ? ? ?Maternal Data ?Has patient been taught Hand Expression?: Yes ?Does the patient have breastfeeding experience prior to this delivery?: No ? ?Feeding ?Mother's Current Feeding Choice: Breast Milk ? ?LATCH Score ?  ? ?  ? ?  ? ?  ? ?  ? ?  ? ? ?Lactation Tools Discussed/Used ?  ? ?Interventions ?Interventions: Breast feeding basics reviewed;Education;LC Services brochure ? ?Discharge ?Pump: Personal;Hands Free (2 pumps for home use) ?WIC Program: No ? ?Consult Status ?Consult Status: Follow-up ?Date: 10/14/21 ?Follow-up type: In-patient ? ? ? ?Parsa Rickett R Lodie Waheed ?10/13/2021, 8:13 AM ? ? ? ?

## 2021-10-14 ENCOUNTER — Encounter: Payer: 59 | Admitting: Advanced Practice Midwife

## 2021-10-18 ENCOUNTER — Encounter: Payer: 59 | Admitting: Family Medicine

## 2021-10-20 ENCOUNTER — Telehealth (HOSPITAL_COMMUNITY): Payer: Self-pay | Admitting: *Deleted

## 2021-10-20 NOTE — Telephone Encounter (Signed)
Mom reports feeling good. No concerns about herself at this time. EPDS=0 [facility] score=1) ?Mom reports baby is doing well. Feeding, peeing, and pooping without difficulty. Safe sleep reviewed. Mom reports no concerns about baby at present. ? ?Duffy Rhody, RN 10-20-2021 at 3:04pm ?

## 2021-10-26 ENCOUNTER — Encounter: Payer: Self-pay | Admitting: Family Medicine

## 2021-10-26 DIAGNOSIS — O9123 Nonpurulent mastitis associated with lactation: Secondary | ICD-10-CM

## 2021-10-26 MED ORDER — SULFAMETHOXAZOLE-TRIMETHOPRIM 800-160 MG PO TABS: 1.0000 | ORAL_TABLET | Freq: Two times a day (BID) | ORAL | 0 refills | Status: AC

## 2021-10-28 ENCOUNTER — Encounter: Payer: 59 | Admitting: Advanced Practice Midwife

## 2021-11-01 ENCOUNTER — Inpatient Hospital Stay (HOSPITAL_COMMUNITY): Admit: 2021-11-01 | Payer: Self-pay

## 2021-11-22 ENCOUNTER — Ambulatory Visit (INDEPENDENT_AMBULATORY_CARE_PROVIDER_SITE_OTHER): Payer: 59 | Admitting: Family Medicine

## 2021-11-22 ENCOUNTER — Encounter: Payer: Self-pay | Admitting: Family Medicine

## 2021-11-22 NOTE — Progress Notes (Signed)
Post Partum Visit Note  Karen Wilcox is a 31 y.o. G16P2002 female who presents for a postpartum visit. She is 6 weeks postpartum following a normal spontaneous vaginal delivery.  I have fully reviewed the prenatal and intrapartum course. The delivery was at 37.1 gestational weeks.  Anesthesia: epidural. Postpartum course has been uncomplicated. Baby is doing well. Baby is feeding by bottle - Simililac . Bleeding no bleeding. Bowel function is normal. Bladder function is normal. Patient is not sexually active. Contraception method is  natural family planning  . Postpartum depression screening: negative.   Upstream - 11/23/21 1311       Pregnancy Intention Screening   Does the patient want to become pregnant in the next year? No    Does the patient's partner want to become pregnant in the next year? No    Would the patient like to discuss contraceptive options today? Yes      Contraception Wrap Up   Current Method FAM or LAM    End Method FAM or LAM    Contraception Counseling Provided Yes            The pregnancy intention screening data noted above was reviewed. Potential methods of contraception were discussed. The patient elected to proceed with FAM or LAM.   Edinburgh Postnatal Depression Scale - 11/22/21 1549       Edinburgh Postnatal Depression Scale:  In the Past 7 Days   I have been able to laugh and see the funny side of things. 0    I have looked forward with enjoyment to things. 0    I have blamed myself unnecessarily when things went wrong. 0    I have been anxious or worried for no good reason. 0    I have felt scared or panicky for no good reason. 0    Things have been getting on top of me. 0    I have been so unhappy that I have had difficulty sleeping. 0    I have felt sad or miserable. 0    I have been so unhappy that I have been crying. 0    The thought of harming myself has occurred to me. 0    Edinburgh Postnatal Depression Scale Total 0              There are no preventive care reminders to display for this patient.  The following portions of the patient's history were reviewed and updated as appropriate: allergies, current medications, past family history, past medical history, past social history, past surgical history, and problem list.  Review of Systems Pertinent items are noted in HPI.  Objective:  BP 109/78   Pulse 77   Wt 143 lb (64.9 kg)   BMI 24.55 kg/m    General:  alert, cooperative, and appears stated age   Breasts:  not indicated  Lungs: clear to auscultation bilaterally  Heart:  regular rate and rhythm, S1, S2 normal, no murmur, click, rub or gallop  Abdomen: soft, non-tender; bowel sounds normal; no masses,  no organomegaly   Wound NA  GU exam:  not indicated       Assessment:    There are no diagnoses linked to this encounter.  Normal postpartum exam.   Plan:   Essential components of care per ACOG recommendations:  1.  Mood and well being: Patient with negative depression screening today. Reviewed local resources for support.  - Patient tobacco use? No.   - hx of drug  use? No.    2. Infant care and feeding:  -Patient currently breastmilk feeding? No.  -Social determinants of health (SDOH) reviewed in EPIC. No concerns-- G1 was placed for adoption  3. Sexuality, contraception and birth spacing - Patient does not want a pregnancy in the next year.  Desired family size is 2 children.  - Reviewed reproductive life planning. Reviewed contraceptive methods based on pt preferences and effectiveness.  Patient desired FAM or LAM today.   - Discussed birth spacing of 18 months  4. Sleep and fatigue -Encouraged family/partner/community support of 4 hrs of uninterrupted sleep to help with mood and fatigue  5. Physical Recovery  - Discussed patients delivery and complications. She describes her labor as good. - Patient had a Vaginal, no problems at delivery. Patient had a 1st degree laceration.  Perineal healing reviewed. Patient expressed understanding - Patient has urinary incontinence? No. - Patient is safe to resume physical and sexual activity  6.  Health Maintenance - HM due items addressed Yes - Last pap smear  Diagnosis  Date Value Ref Range Status  04/28/2021   Final   - Negative for intraepithelial lesion or malignancy (NILM)   Pap smear not done at today's visit.  -Breast Cancer screening indicated? No.   7. Chronic Disease/Pregnancy Condition follow up: None  - PCP follow up  Federico Flake, MD Center for [facility], [facility]

## 2021-11-23 ENCOUNTER — Encounter: Payer: Self-pay | Admitting: Family Medicine

## 2021-11-30 ENCOUNTER — Encounter: Payer: Self-pay | Admitting: Family Medicine

## 2021-12-01 ENCOUNTER — Encounter: Payer: Self-pay | Admitting: *Deleted

## 2021-12-22 ENCOUNTER — Other Ambulatory Visit: Payer: Self-pay

## 2021-12-22 ENCOUNTER — Encounter: Payer: Self-pay | Admitting: Family Medicine

## 2021-12-22 DIAGNOSIS — F53 Postpartum depression: Secondary | ICD-10-CM

## 2021-12-23 ENCOUNTER — Encounter: Payer: Self-pay | Admitting: Advanced Practice Midwife

## 2021-12-23 ENCOUNTER — Telehealth (INDEPENDENT_AMBULATORY_CARE_PROVIDER_SITE_OTHER): Payer: 59 | Admitting: Advanced Practice Midwife

## 2021-12-23 DIAGNOSIS — F53 Postpartum depression: Secondary | ICD-10-CM | POA: Diagnosis not present

## 2021-12-23 NOTE — Progress Notes (Signed)
Patient left message yesterday requesting to repeat EPDS for PPD.   EPDS =12   Pt has appt with Behavioral Health already scheduled.

## 2021-12-24 ENCOUNTER — Ambulatory Visit (INDEPENDENT_AMBULATORY_CARE_PROVIDER_SITE_OTHER): Payer: 59 | Admitting: Licensed Clinical Social Worker

## 2021-12-24 DIAGNOSIS — F53 Postpartum depression: Secondary | ICD-10-CM | POA: Diagnosis not present

## 2021-12-24 NOTE — Progress Notes (Signed)
   OBSTETRICS POSTPARTUM VIRTUAL VISIT ENCOUNTER NOTE  Provider location: Center for [facility] at [facility]   Patient location: Home  I connected with Karen Wilcox on 12/24/21 at  1:10 PM EDT by MyChart Video Encounter and verified that I am speaking with the correct person using two identifiers. I discussed the limitations, risks, security and privacy concerns of performing an evaluation and management service virtually and the availability of in person appointments. I also discussed with the patient that there may be a patient responsible charge related to this service. The patient expressed understanding and agreed to proceed. Subjective:  Karen Wilcox is a 31 y.o. G2P2002 at Unknown being seen today for ongoing prenatal care.  She is currently monitored for the following issues for this low-risk pregnancy and does not have any active problems on file.  Patient reports  concern for postpartum depression. She verbalizes feeling "not much enjoyment from life". She reports having "lots of help" and mild to moderate guilt associated with feeling depressed despite the amount of help she receives .   The following portions of the patient's history were reviewed and updated as appropriate: allergies, current medications, past family history, past medical history, past social history, past surgical history and problem list.   Objective:  There were no vitals filed for this visit.  Fetal Status:           General:  Alert, oriented and cooperative. Patient is in no acute distress.  Respiratory: Normal respiratory effort, no problems with respiration noted  Mental Status: Normal mood and affect. Normal behavior. Normal judgment and thought content.  Rest of physical exam deferred due to type of encounter  Imaging: No results found.  Assessment and Plan:  Pregnancy: G2P2002 at Unknown 1. Vaginal delivery - Hx SVD 10/12/2021 s/p low risk pregnancy - 1st degree perineal  lac, repair not indication  2. Post partum depression - Negative depression screening at postpartum visit on 11/22/2021 - EPDS = 12 today - Patient doing so well recognizing change in baseline mood - Already scheduled with Gwyndolyn Saxon, LCSW and [facility] tomorrow based on MyChart messages - Patient to pursue sister in law's referral to mental heath counselor who specializes in PPD - Can consider initiation of medication, referral to Psych for ongoing management as needed  I discussed the assessment and treatment plan with the patient. The patient was provided an opportunity to ask questions and all were answered. The patient agreed with the plan and demonstrated an understanding of the instructions. The patient was advised to call back or seek an in-person office evaluation/go to [facility] as needed Please refer to After Visit Summary for other counseling recommendations.   I provided ten minutes of face-to-face time during this encounter.   Future Appointments  Date Time Provider Department Center  12/24/2021 11:00 AM Gwyndolyn Saxon, LCSW [facility] None    Calvert Cantor, CNM Center for [employer], [facility]

## 2021-12-27 NOTE — BH Specialist Note (Signed)
Integrated Behavioral Health via Telemedicine Visit  12/27/2021 [name] [id number]  Number of Integrated Behavioral Health Clinician visits: 1 Session Start time:  1100am Session End time: 1143am Total time in minutes: 43 mins via mychart video   Referring Provider:  Knox Saliva CNM Patient/Family location: Home  [facility] Provider location: [facility]  All persons participating in visit: Pt Karen Wilcox and LCSW A. Mirta Mally  Types of Service: Individual psychotherapy and Video visit  I connected with [name] and/or Lorenda Ishihara n/a via  Telephone or [employer]  (Video is Surveyor, mining) and verified that I am speaking with the correct person using two identifiers. Discussed confidentiality: Yes   I discussed the limitations of telemedicine and the availability of in person appointments.  Discussed there is a possibility of technology failure and discussed alternative modes of communication if that failure occurs.  I discussed that engaging in this telemedicine visit, they consent to the provision of behavioral healthcare and the services will be billed under their insurance.  Patient and/or legal guardian expressed understanding and consented to Telemedicine visit: Yes   Presenting Concerns: Patient and/or family reports the following symptoms/concerns: postpartum depression  Duration of problem: approximately 4 weeks ; Severity of problem: mild  Patient and/or Family's Strengths/Protective Factors: Concrete supports in place (healthy food, safe environments, etc.)  Goals Addressed: Patient will:  Reduce symptoms of: depression   Increase knowledge and/or ability of: coping skills   Demonstrate ability to: Increase healthy adjustment to current life circumstances  Progress towards Goals: Ongoing  Interventions: Interventions utilized:  Motivational Interviewing, Mindfulness or Management consultant, and Supportive  Counseling Standardized Assessments completed: PHQ 9  Patient and/or Family Response: [name] responded well to mychart visit.   Assessment: Patient currently experiencing postpartum depression.   Patient may benefit from integrated behavioral health.  Plan: Follow up with behavioral health clinician on : 01/14/2022 Behavioral recommendations: Prioritize rest and practice good sleeping habits no cell phones, social media and sleeping with tv on. Remove expectations about parenting and do not compare experiences with anyone.  Referral(s): Integrated [employer] (In Clinic)  I discussed the assessment and treatment plan with the patient and/or parent/guardian. They were provided an opportunity to ask questions and all were answered. They agreed with the plan and demonstrated an understanding of the instructions.   They were advised to call back or seek an in-person evaluation if the symptoms worsen or if the condition fails to improve as anticipated.  Gwyndolyn Saxon, LCSW

## 2021-12-31 ENCOUNTER — Encounter: Payer: Self-pay | Admitting: Advanced Practice Midwife

## 2022-01-14 ENCOUNTER — Telehealth: Payer: Self-pay | Admitting: Licensed Clinical Social Worker

## 2022-01-14 ENCOUNTER — Encounter: Payer: 59 | Admitting: Licensed Clinical Social Worker

## 2022-01-14 NOTE — Telephone Encounter (Signed)
Called pt regarding scheduled mychart. Left message requesting callback

## 2022-02-07 ENCOUNTER — Encounter: Payer: Self-pay | Admitting: Advanced Practice Midwife

## 2022-02-08 ENCOUNTER — Other Ambulatory Visit: Payer: Self-pay

## 2022-02-08 DIAGNOSIS — F53 Postpartum depression: Secondary | ICD-10-CM

## 2022-02-08 MED ORDER — SERTRALINE HCL 25 MG PO TABS: 25.0000 mg | ORAL_TABLET | Freq: Every day | ORAL | 0 refills | Status: DC

## 2022-02-08 NOTE — Progress Notes (Signed)
Referral placed as advised by provider.

## 2022-02-08 NOTE — Progress Notes (Signed)
Rx sent as advised by provider via Mychart message.

## 2022-02-10 ENCOUNTER — Ambulatory Visit (INDEPENDENT_AMBULATORY_CARE_PROVIDER_SITE_OTHER): Payer: 59 | Admitting: Licensed Clinical Social Worker

## 2022-02-10 DIAGNOSIS — F53 Postpartum depression: Secondary | ICD-10-CM

## 2022-02-15 NOTE — BH Specialist Note (Signed)
Integrated Behavioral Health via Telemedicine Visit  02/15/2022 Karen Wilcox [id number]  Number of Integrated Behavioral Health Clinician visits: 2  Session Start time: 10:00   Session End time: 10:24am Total time in minutes: 24 mins via mychart video   Referring Provider: S. Weinhold CNM Patient/Family location: Home  [facility] Provider location: [facility]  All persons participating in visit: Karen Wilcox and LCSW A. Foch Rosenwald  Types of Service: Individual psychotherapy and Video visit  I connected with Karen Wilcox and/or Karen Wilcox n/a via  Telephone or [employer]  (Video is Surveyor, mining) and verified that I am speaking with the correct person using two identifiers. Discussed confidentiality: Yes   I discussed the limitations of telemedicine and the availability of in person appointments.  Discussed there is a possibility of technology failure and discussed alternative modes of communication if that failure occurs.  I discussed that engaging in this telemedicine visit, they consent to the provision of behavioral healthcare and the services will be billed under their insurance.  Patient and/or legal guardian expressed understanding and consented to Telemedicine visit: Yes   Presenting Concerns: Patient and/or family reports the following symptoms/concerns: depressed mood, difficulty adjusting, limited social support,  Duration of problem: approx 5 months; Severity of problem: mild  Patient and/or Family's Strengths/Protective Factors: Concrete supports in place (healthy food, safe environments, etc.)  Goals Addressed: Patient will:  Reduce symptoms of: depression and stress   Increase knowledge and/or ability of: coping skills and stress reduction   Demonstrate ability to: Increase healthy adjustment to current life circumstances  Progress towards Goals: Ongoing  Interventions: Interventions utilized:  Motivational  Interviewing and Supportive Counseling Standardized Assessments completed: [place] Postnatal Depression  Patient and/or Family Response: Karen Wilcox responded well to mychart visit  Assessment: Patient currently experiencing postpartum depression.   Patient may benefit from integrated behavioral health. Karen Wilcox reports she is currently seeking counseling with an third party provider.   Plan: Follow up with behavioral health clinician on : 2 weeks via mychart  Behavioral recommendations: take medication as directed, prioritize rest and self care, practice coping skills mindfulness and grounding techniques  Referral(s): Integrated [employer] (In Clinic)  I discussed the assessment and treatment plan with the patient and/or parent/guardian. They were provided an opportunity to ask questions and all were answered. They agreed with the plan and demonstrated an understanding of the instructions.   They were advised to call back or seek an in-person evaluation if the symptoms worsen or if the condition fails to improve as anticipated.  Gwyndolyn Saxon, LCSW

## 2022-03-01 ENCOUNTER — Encounter: Payer: 59 | Admitting: Licensed Clinical Social Worker

## 2022-03-09 ENCOUNTER — Ambulatory Visit (INDEPENDENT_AMBULATORY_CARE_PROVIDER_SITE_OTHER): Payer: Self-pay | Admitting: Licensed Clinical Social Worker

## 2022-03-09 DIAGNOSIS — F53 Postpartum depression: Secondary | ICD-10-CM

## 2022-03-10 NOTE — BH Specialist Note (Signed)
Integrated Behavioral Health via Telemedicine Visit  03/10/2022 Shanay Woolman [id number]  Number of Integrated Behavioral Health Clinician visits: 3 Session Start time:  10:30am Session End time: 10:42am Total time in minutes: 12 mins via mychart video   Referring Provider: S. Weinhold CNM Patient/Family location: Home  [facility] Provider location: [place]  All persons participating in visit: Pt E Swartz and LCSW A. Marilea Gwynne  Types of Service: Individual psychotherapy and Video visit  I connected with [name] and/or [place] via  Telephone or [employer]  (Video is Tree surgeon) and verified that I am speaking with the correct person using two identifiers. Discussed confidentiality: Yes   I discussed the limitations of telemedicine and the availability of in person appointments.  Discussed there is a possibility of technology failure and discussed alternative modes of communication if that failure occurs.  I discussed that engaging in this telemedicine visit, they consent to the provision of behavioral healthcare and the services will be billed under their insurance.  Patient and/or legal guardian expressed understanding and consented to Telemedicine visit: Yes   Presenting Concerns: Patient and/or family reports the following symptoms/concerns: postpartum depression Duration of problem: approx 6 months ; Severity of problem: mild  Patient and/or Family's Strengths/Protective Factors: Concrete supports in place (healthy food, safe environments, etc.)  Goals Addressed: Patient will:  Reduce symptoms of: depression   Increase knowledge and/or ability of: coping skills   Demonstrate ability to: Increase healthy adjustment to current life circumstances  Progress towards Goals: Achieved  Interventions: Interventions utilized:  Supportive Counseling Standardized Assessments completed: Flavia Shipper Postnatal  Depression  Patient and/or Family Response: Mrs.Mancini reports her mood has improved with  therapy and zoloft. Mrs. Tartt reports her sleeping patterns have improved as well. Mrs. Bousquet reports she will continue with prescription as directed and community mental health   Assessment: Patient currently experiencing postpartum depression.   Patient may benefit from community outpatient behavioral health.  Plan: Follow up with behavioral health clinician on : prn Behavioral recommendations: Continue with medication as directed and outpatient therapy  Referral(s): [facility] (LME/Outside Clinic)  I discussed the assessment and treatment plan with the patient and/or parent/guardian. They were provided an opportunity to ask questions and all were answered. They agreed with the plan and demonstrated an understanding of the instructions.   They were advised to call back or seek an in-person evaluation if the symptoms worsen or if the condition fails to improve as anticipated.  Lynnea Ferrier, LCSW

## 2022-03-22 ENCOUNTER — Other Ambulatory Visit: Payer: Self-pay | Admitting: Advanced Practice Midwife

## 2022-03-23 MED ORDER — SERTRALINE HCL 25 MG PO TABS: 25.0000 mg | ORAL_TABLET | Freq: Every day | ORAL | 11 refills | Status: DC

## 2022-10-27 ENCOUNTER — Other Ambulatory Visit: Payer: Self-pay | Admitting: *Deleted

## 2022-10-27 MED ORDER — SERTRALINE HCL 25 MG PO TABS
25.0000 mg | ORAL_TABLET | Freq: Every day | ORAL | 3 refills | Status: DC
Start: 2022-10-27 — End: 2023-01-26

## 2022-10-27 NOTE — Telephone Encounter (Signed)
Received a fax from [facility] requesting change to 90 day refill of Sertraline 25 mg. Per chart review is patient at [facility], will route to that office. Nancy Fetter

## 2023-01-26 ENCOUNTER — Encounter: Payer: Self-pay | Admitting: Family Medicine

## 2023-01-26 ENCOUNTER — Ambulatory Visit: Payer: 59 | Admitting: Family Medicine

## 2023-01-26 VITALS — BP 117/78 | HR 88 | Ht 64.0 in | Wt 147.6 lb

## 2023-01-26 DIAGNOSIS — M722 Plantar fascial fibromatosis: Secondary | ICD-10-CM | POA: Diagnosis not present

## 2023-01-26 DIAGNOSIS — Z8659 Personal history of other mental and behavioral disorders: Secondary | ICD-10-CM

## 2023-01-26 DIAGNOSIS — Z0000 Encounter for general adult medical examination without abnormal findings: Secondary | ICD-10-CM | POA: Diagnosis not present

## 2023-01-26 DIAGNOSIS — J351 Hypertrophy of tonsils: Secondary | ICD-10-CM

## 2023-01-26 DIAGNOSIS — I479 Paroxysmal tachycardia, unspecified: Secondary | ICD-10-CM

## 2023-01-26 DIAGNOSIS — R053 Chronic cough: Secondary | ICD-10-CM | POA: Diagnosis not present

## 2023-01-26 DIAGNOSIS — Z8249 Family history of ischemic heart disease and other diseases of the circulatory system: Secondary | ICD-10-CM | POA: Diagnosis not present

## 2023-01-26 DIAGNOSIS — Z8679 Personal history of other diseases of the circulatory system: Secondary | ICD-10-CM

## 2023-01-26 DIAGNOSIS — Z833 Family history of diabetes mellitus: Secondary | ICD-10-CM

## 2023-01-26 MED ORDER — AIRSUPRA 90-80 MCG/ACT IN AERO: 1.0000 | INHALATION_SPRAY | RESPIRATORY_TRACT | Status: AC | PRN

## 2023-01-26 MED ORDER — DICLOFENAC SODIUM 1 % EX GEL: 4.0000 g | Freq: Four times a day (QID) | CUTANEOUS | Status: AC

## 2023-01-26 MED ORDER — TRELEGY ELLIPTA 200-62.5-25 MCG/ACT IN AEPB: 1.0000 | INHALATION_SPRAY | Freq: Every day | RESPIRATORY_TRACT | Status: AC

## 2023-01-26 NOTE — Progress Notes (Signed)
New patient visit   Patient: Karen Wilcox   DOB: [birth year]   32 y.o. Female  MRN: [id number] Visit Date: 01/26/2023  Today's healthcare provider: Jacky Kindle, FNP  Introduced to nurse practitioner role and practice setting.  All questions answered.  Discussed provider/patient relationship and expectations.  Chief Complaint  Patient presents with   Establish Care    Lingering cough for about 2 weeks, has pain in right foot, arch,  that radiates to the leg, pain is from heel to ball of foot, occurs when walking, prior treatment was inhaler    Subjective    Karen Wilcox is a 32 y.o. female who presents today as a new patient to establish care.  HPI HPI     Establish Care    Additional comments: Lingering cough for about 2 weeks, has pain in right foot, arch,  that radiates to the leg, pain is from heel to ball of foot, occurs when walking, prior treatment was inhaler       Last edited by Rolly Salter, CMA on 01/26/2023  3:02 PM.      Past Medical History:  Diagnosis Date   Medical history non-contributory    Past Surgical History:  Procedure Laterality Date   APPENDECTOMY     APPENDECTOMY     5 years ago?   LAPAROSCOPIC APPENDECTOMY N/A 05/22/2016   Procedure: APPENDECTOMY LAPAROSCOPIC;  Surgeon: Ricarda Frame, MD;  Location: [facility] ORS;  Service: General;  Laterality: N/A;   Family Status  Relation Name Status   Mother  Alive   Father  Alive  No partnership data on file   Family History  Problem Relation Age of Onset   Heart disease Father        stents placement   Heart attack Father 24   Hypotension Father    Social History   Socioeconomic History   Marital status: Married    Spouse name: Berton Bon   Number of children: Not on file   Years of education: Not on file   Highest education level: GED or equivalent  Occupational History   Not on file  Tobacco Use   Smoking status: Never   Smokeless tobacco: Never  Vaping Use    Vaping status: Never Used  Substance and Sexual Activity   Alcohol use: No   Drug use: No   Sexual activity: Yes    Comment: Stopped OCP's 06/2019  Other Topics Concern   Not on file  Social History Narrative   Not on file   Social Determinants of Health   Financial Resource Strain: Low Risk  (01/24/2023)   Overall Financial Resource Strain (CARDIA)    Difficulty of Paying Living Expenses: Not hard at all  Food Insecurity: No Food Insecurity (01/24/2023)   Hunger Vital Sign    Worried About Running Out of Food in the Last Year: Never true    Ran Out of Food in the Last Year: Never true  Transportation Needs: No Transportation Needs (01/24/2023)   PRAPARE - Administrator, Civil Service (Medical): No    Lack of Transportation (Non-Medical): No  Physical Activity: Insufficiently Active (01/24/2023)   Exercise Vital Sign    Days of Exercise per Week: 4 days    Minutes of Exercise per Session: 20 min  Stress: No Stress Concern Present (01/24/2023)   [employer] of Occupational Health - Occupational Stress Questionnaire    Feeling of Stress : Not at all  Social Connections: Moderately Isolated (  01/24/2023)   Social Connection and Isolation Panel [NHANES]    Frequency of Communication with Friends and Family: Once a week    Frequency of Social Gatherings with Friends and Family: Three times a week    Attends Religious Services: Never    Active Member of Clubs or Organizations: No    Attends Engineer, structural: Not on file    Marital Status: Married   Outpatient Medications Prior to Visit  Medication Sig   [DISCONTINUED] acetaminophen (TYLENOL) 500 MG tablet Take 2 tablets (1,000 mg total) by mouth every 8 (eight) hours as needed (pain).   [DISCONTINUED] ibuprofen (ADVIL) 600 MG tablet Take 1 tablet (600 mg total) by mouth every 6 (six) hours as needed (pain).   [DISCONTINUED] calcium carbonate (TUMS - DOSED IN MG ELEMENTAL CALCIUM) 500 MG chewable tablet  Chew 1,000 mg by mouth daily as needed for indigestion or heartburn. (Patient not taking: Reported on 12/23/2021)   [DISCONTINUED] ferrous sulfate 325 (65 FE) MG tablet Take 325 mg by mouth every other day. (Patient not taking: Reported on 12/23/2021)   [DISCONTINUED] Prenatal Vit-Fe Fumarate-FA (MULTIVITAMIN-PRENATAL) 27-0.8 MG TABS tablet Take 1 tablet by mouth daily at 12 noon. (Patient not taking: Reported on 12/23/2021)   [DISCONTINUED] sertraline (ZOLOFT) 25 MG tablet Take 1 tablet (25 mg total) by mouth daily. (Patient not taking: Reported on 01/26/2023)   No facility-administered medications prior to visit.   Allergies  Allergen Reactions   Zantac [Ranitidine Hcl] Hives    Immunization History  Administered Date(s) Administered   Tdap 08/05/2021    Health Maintenance  Topic Date Due   COVID-19 Vaccine (1 - 2023-24 season) Never done   INFLUENZA VACCINE  09/11/2023 (Originally 01/12/2023)   PAP SMEAR-Modifier  04/28/2024   DTaP/Tdap/Td (2 - Td or Tdap) 08/06/2031   Hepatitis C Screening  Completed   HIV Screening  Completed   HPV VACCINES  Aged Out    Patient Care Team: Jacky Kindle, FNP as PCP - General (Family Medicine)  Review of Systems   Objective    BP 117/78 (BP Location: Left Arm, Patient Position: Sitting, Cuff Size: Large)   Pulse 88   Ht 5\' 4"  (1.626 m)   Wt 147 lb 9.6 oz (67 kg)   SpO2 98%   BMI 25.34 kg/m   Physical Exam Vitals and nursing note reviewed.  Constitutional:      General: She is awake. She is not in acute distress.    Appearance: Normal appearance. She is well-developed, well-groomed and normal weight. She is not ill-appearing, toxic-appearing or diaphoretic.  HENT:     Head: Normocephalic and atraumatic.     Jaw: There is normal jaw occlusion. No trismus, tenderness, swelling or pain on movement.     Right Ear: Hearing, tympanic membrane, ear canal and external ear normal. There is no impacted cerumen.     Left Ear: Hearing, tympanic  membrane, ear canal and external ear normal. There is no impacted cerumen.     Nose: Nose normal. No congestion or rhinorrhea.     Right Turbinates: Not enlarged, swollen or pale.     Left Turbinates: Not enlarged, swollen or pale.     Right Sinus: No maxillary sinus tenderness or frontal sinus tenderness.     Left Sinus: No maxillary sinus tenderness or frontal sinus tenderness.     Mouth/Throat:     Lips: Pink.     Mouth: Mucous membranes are moist. No injury.     Tongue:  No lesions.     Pharynx: Oropharynx is clear. Uvula midline. No pharyngeal swelling, oropharyngeal exudate, posterior oropharyngeal erythema or uvula swelling.     Tonsils: No tonsillar exudate or tonsillar abscesses.  Eyes:     General: Lids are normal. Lids are everted, no foreign bodies appreciated. Vision grossly intact. Gaze aligned appropriately. No allergic shiner or visual field deficit.       Right eye: No discharge.        Left eye: No discharge.     Extraocular Movements: Extraocular movements intact.     Conjunctiva/sclera: Conjunctivae normal.     Right eye: Right conjunctiva is not injected. No exudate.    Left eye: Left conjunctiva is not injected. No exudate.    Pupils: Pupils are equal, round, and reactive to light.  Neck:     Thyroid: No thyroid mass, thyromegaly or thyroid tenderness.     Vascular: No carotid bruit.     Trachea: Trachea normal.  Cardiovascular:     Rate and Rhythm: Normal rate and regular rhythm.     Pulses: Normal pulses.          Carotid pulses are 2+ on the right side and 2+ on the left side.      Radial pulses are 2+ on the right side and 2+ on the left side.       Dorsalis pedis pulses are 2+ on the right side and 2+ on the left side.       Posterior tibial pulses are 2+ on the right side and 2+ on the left side.     Heart sounds: Normal heart sounds, S1 normal and S2 normal. No murmur heard.    No friction rub. No gallop.  Pulmonary:     Effort: Pulmonary effort is  normal. No respiratory distress.     Breath sounds: Normal breath sounds and air entry. No stridor. No wheezing, rhonchi or rales.  Chest:     Chest wall: No tenderness.  Abdominal:     General: Abdomen is flat. Bowel sounds are normal. There is no distension.     Palpations: Abdomen is soft. There is no mass.     Tenderness: There is no abdominal tenderness. There is no right CVA tenderness, left CVA tenderness, guarding or rebound.     Hernia: No hernia is present.  Genitourinary:    Comments: Exam deferred; denies complaints Musculoskeletal:        General: No swelling, tenderness, deformity or signs of injury. Normal range of motion.     Cervical back: Full passive range of motion without pain, normal range of motion and neck supple. No edema, rigidity or tenderness. No muscular tenderness.     Right lower leg: No edema.     Left lower leg: No edema.  Lymphadenopathy:     Cervical: No cervical adenopathy.     Right cervical: No superficial, deep or posterior cervical adenopathy.    Left cervical: No superficial, deep or posterior cervical adenopathy.  Skin:    General: Skin is warm and dry.     Capillary Refill: Capillary refill takes less than 2 seconds.     Coloration: Skin is not jaundiced or pale.     Findings: No bruising, erythema, lesion or rash.  Neurological:     General: No focal deficit present.     Mental Status: She is alert and oriented to person, place, and time. Mental status is at baseline.     GCS: GCS eye subscore  is 4. GCS verbal subscore is 5. GCS motor subscore is 6.     Sensory: Sensation is intact. No sensory deficit.     Motor: Motor function is intact. No weakness.     Coordination: Coordination is intact. Coordination normal.     Gait: Gait is intact. Gait normal.  Psychiatric:        Attention and Perception: Attention and perception normal.        Mood and Affect: Mood and affect normal.        Speech: Speech normal.        Behavior: Behavior  normal. Behavior is cooperative.        Thought Content: Thought content normal.        Cognition and Memory: Cognition and memory normal.        Judgment: Judgment normal.    Depression Screen    01/26/2023    3:05 PM 10/06/2021    1:32 PM 04/07/2021    2:29 PM  PHQ 2/9 Scores  PHQ - 2 Score 0 0 0  PHQ- 9 Score 0 0    No results found for any visits on 01/26/23.  Assessment & Plan      Problem List Items Addressed This Visit       Cardiovascular and Mediastinum   Paroxysmal tachycardia (HCC)    Chronic, intermittent Repeat labs including CBC, CMP, TSH Normal R/R today, no MRG      Relevant Medications   diclofenac Sodium (VOLTAREN ARTHRITIS PAIN) 1 % GEL   Other Relevant Orders   CBC with Differential/Platelet   Comprehensive Metabolic Panel (CMET)   TSH + free T4   Hemoglobin A1c   Lipid panel   Vitamin D (25 hydroxy)   Ambulatory referral to Podiatry     Musculoskeletal and Integument   Plantar fasciitis    Acute, worsening Not improved OTC NSAIDs Recommend stretching, referral, topical NSAIDs F/u as needed      Relevant Medications   diclofenac Sodium (VOLTAREN ARTHRITIS PAIN) 1 % GEL   Other Relevant Orders   Ambulatory referral to Podiatry     Other   Annual physical exam - Primary    Due for dental and vision Things to do to keep yourself healthy  - Exercise at least 30-45 minutes a day, 3-4 days a week.  - Eat a low-fat diet with lots of fruits and vegetables, up to 7-9 servings per day.  - Seatbelts can save your Wilcox. Wear them always.  - Smoke detectors on every level of your home, check batteries every year.  - Eye Doctor - have an eye exam every 1-2 years  - Safe sex - if you may be exposed to STDs, use a condom.  - Alcohol -  If you drink, do it moderately, less than 2 drinks per day.  - Health Care Power of Attorney. Choose someone to speak for you if you are not able.  - Depression is common in our stressful world.If you're feeling down  or losing interest in things you normally enjoy, please come in for a visit.  - Violence - If anyone is threatening or hurting you, please call immediately.       Relevant Medications   diclofenac Sodium (VOLTAREN ARTHRITIS PAIN) 1 % GEL   Other Relevant Orders   CBC with Differential/Platelet   Comprehensive Metabolic Panel (CMET)   TSH + free T4   Hemoglobin A1c   Lipid panel   Vitamin D (25 hydroxy)  Ambulatory referral to Podiatry   Chronic cough    Unknown cause; denies c/f GERD Recommend trial of treatment for asthma       Relevant Medications   TRELEGY ELLIPTA 200-62.5-25 MCG/ACT AEPB   Other Relevant Orders   Ambulatory referral to Pulmonology   Enlarged tonsils    Chronic, grade 3 No exudate Continue to monitor       Relevant Medications   diclofenac Sodium (VOLTAREN ARTHRITIS PAIN) 1 % GEL   Other Relevant Orders   CBC with Differential/Platelet   Comprehensive Metabolic Panel (CMET)   TSH + free T4   Hemoglobin A1c   Lipid panel   Vitamin D (25 hydroxy)   Ambulatory referral to Podiatry   Family history of diabetes mellitus    Continue to recommend balanced, lower carb meals. Smaller meal size, adding snacks. Choosing water as drink of choice and increasing purposeful exercise. Recommend A1c       Relevant Medications   diclofenac Sodium (VOLTAREN ARTHRITIS PAIN) 1 % GEL   Other Relevant Orders   CBC with Differential/Platelet   Comprehensive Metabolic Panel (CMET)   TSH + free T4   Hemoglobin A1c   Lipid panel   Vitamin D (25 hydroxy)   Ambulatory referral to Podiatry   Family history of early CAD    Recommend LP recommend diet low in saturated fat and regular exercise - 30 min at least 5 times per week       Relevant Medications   diclofenac Sodium (VOLTAREN ARTHRITIS PAIN) 1 % GEL   Other Relevant Orders   CBC with Differential/Platelet   Comprehensive Metabolic Panel (CMET)   TSH + free T4   Hemoglobin A1c   Lipid panel   Vitamin  D (25 hydroxy)   Ambulatory referral to Podiatry   History of depression    Pt denies concerns at this time; was on low dose zoloft previously       Relevant Medications   diclofenac Sodium (VOLTAREN ARTHRITIS PAIN) 1 % GEL   Other Relevant Orders   CBC with Differential/Platelet   Comprehensive Metabolic Panel (CMET)   TSH + free T4   Hemoglobin A1c   Lipid panel   Vitamin D (25 hydroxy)   Ambulatory referral to Podiatry   RESOLVED: History of supraventricular tachycardia   Return if symptoms worsen or fail to improve.    Karen Merl, FNP, have reviewed all documentation for this visit. The documentation on 01/26/23 for the exam, diagnosis, procedures, and orders are all accurate and complete.  Jacky Kindle, FNP  [facility] [phone] (phone) [phone] (fax)  [facility]

## 2023-01-26 NOTE — Assessment & Plan Note (Signed)
Due for dental and vision Things to do to keep yourself healthy  - Exercise at least 30-45 minutes a day, 3-4 days a week.  - Eat a low-fat diet with lots of fruits and vegetables, up to 7-9 servings per day.  - Seatbelts can save your life. Wear them always.  - Smoke detectors on every level of your home, check batteries every year.  - Eye Doctor - have an eye exam every 1-2 years  - Safe sex - if you may be exposed to STDs, use a condom.  - Alcohol -  If you drink, do it moderately, less than 2 drinks per day.  - Health Care Power of Attorney. Choose someone to speak for you if you are not able.  - Depression is common in our stressful world.If you're feeling down or losing interest in things you normally enjoy, please come in for a visit.  - Violence - If anyone is threatening or hurting you, please call immediately.  

## 2023-01-26 NOTE — Assessment & Plan Note (Signed)
Acute, worsening Not improved OTC NSAIDs Recommend stretching, referral, topical NSAIDs F/u as needed

## 2023-01-26 NOTE — Assessment & Plan Note (Signed)
Pt denies concerns at this time; was on low dose zoloft previously

## 2023-01-26 NOTE — Assessment & Plan Note (Signed)
Chronic, grade 3 No exudate Continue to monitor

## 2023-01-26 NOTE — Assessment & Plan Note (Signed)
 Recommend LP recommend diet low in saturated fat and regular exercise - 30 min at least 5 times per week

## 2023-01-26 NOTE — Assessment & Plan Note (Signed)
Chronic, intermittent Repeat labs including CBC, CMP, TSH Normal R/R today, no MRG

## 2023-01-26 NOTE — Assessment & Plan Note (Signed)
Unknown cause; denies c/f GERD Recommend trial of treatment for asthma

## 2023-01-26 NOTE — Assessment & Plan Note (Signed)
Continue to recommend balanced, lower carb meals. Smaller meal size, adding snacks. Choosing water as drink of choice and increasing purposeful exercise. Recommend A1c

## 2023-01-27 LAB — CBC WITH DIFFERENTIAL/PLATELET
Basophils Absolute: 0 10*3/uL (ref 0.0–0.2)
Basos: 0 %
EOS (ABSOLUTE): 0.1 10*3/uL (ref 0.0–0.4)
Eos: 1 %
Hematocrit: 40.7 % (ref 34.0–46.6)
Hemoglobin: 13.6 g/dL (ref 11.1–15.9)
Immature Grans (Abs): 0 10*3/uL (ref 0.0–0.1)
Immature Granulocytes: 0 %
Lymphocytes Absolute: 1.8 10*3/uL (ref 0.7–3.1)
Lymphs: 19 %
MCH: 28 pg (ref 26.6–33.0)
MCHC: 33.4 g/dL (ref 31.5–35.7)
MCV: 84 fL (ref 79–97)
Monocytes Absolute: 0.8 10*3/uL (ref 0.1–0.9)
Monocytes: 9 %
Neutrophils Absolute: 6.9 10*3/uL (ref 1.4–7.0)
Neutrophils: 71 %
Platelets: 337 10*3/uL (ref 150–450)
RBC: 4.85 x10E6/uL (ref 3.77–5.28)
RDW: 12.8 % (ref 11.7–15.4)
WBC: 9.7 10*3/uL (ref 3.4–10.8)

## 2023-01-27 LAB — LIPID PANEL
Chol/HDL Ratio: 4 ratio (ref 0.0–4.4)
Cholesterol, Total: 199 mg/dL (ref 100–199)
HDL: 50 mg/dL (ref >39)
LDL Chol Calc (NIH): 121 mg/dL — ABNORMAL HIGH (ref 0–99)
Triglycerides: 158 mg/dL — ABNORMAL HIGH (ref 0–149)
VLDL Cholesterol Cal: 28 mg/dL (ref 5–40)

## 2023-01-27 LAB — COMPREHENSIVE METABOLIC PANEL
ALT: 12 IU/L (ref 0–32)
AST: 16 IU/L (ref 0–40)
Albumin: 4.8 g/dL (ref 3.9–4.9)
Alkaline Phosphatase: 84 IU/L (ref 44–121)
BUN/Creatinine Ratio: 18 (ref 9–23)
BUN: 12 mg/dL (ref 6–20)
Bilirubin Total: 0.4 mg/dL (ref 0.0–1.2)
CO2: 19 mmol/L — ABNORMAL LOW (ref 20–29)
Calcium: 10.1 mg/dL (ref 8.7–10.2)
Chloride: 102 mmol/L (ref 96–106)
Creatinine, Ser: 0.67 mg/dL (ref 0.57–1.00)
Globulin, Total: 2.9 g/dL (ref 1.5–4.5)
Glucose: 96 mg/dL (ref 70–99)
Potassium: 3.8 mmol/L (ref 3.5–5.2)
Sodium: 138 mmol/L (ref 134–144)
Total Protein: 7.7 g/dL (ref 6.0–8.5)
eGFR: 119 mL/min/{1.73_m2} (ref >59)

## 2023-01-27 LAB — TSH+FREE T4
Free T4: 0.97 ng/dL (ref 0.82–1.77)
TSH: 2.26 u[IU]/mL (ref 0.450–4.500)

## 2023-01-27 LAB — VITAMIN D 25 HYDROXY (VIT D DEFICIENCY, FRACTURES): Vit D, 25-Hydroxy: 35.3 ng/mL (ref 30.0–100.0)

## 2023-01-27 LAB — HEMOGLOBIN A1C
Est. average glucose Bld gHb Est-mCnc: 114 mg/dL
Hgb A1c MFr Bld: 5.6 % (ref 4.8–5.6)

## 2023-02-08 ENCOUNTER — Encounter: Payer: Self-pay | Admitting: Family Medicine

## 2023-02-09 ENCOUNTER — Other Ambulatory Visit: Payer: Self-pay | Admitting: Family Medicine

## 2023-02-09 DIAGNOSIS — R7303 Prediabetes: Secondary | ICD-10-CM

## 2023-02-21 ENCOUNTER — Ambulatory Visit: Payer: 59 | Admitting: Student in an Organized Health Care Education/Training Program

## 2023-02-21 ENCOUNTER — Encounter: Payer: Self-pay | Admitting: Student in an Organized Health Care Education/Training Program

## 2023-02-21 VITALS — BP 100/66 | HR 90 | Temp 97.6°F | Ht 64.0 in | Wt 145.0 lb

## 2023-02-21 DIAGNOSIS — R053 Chronic cough: Secondary | ICD-10-CM

## 2023-02-21 LAB — NITRIC OXIDE: Nitric Oxide: 11

## 2023-02-21 NOTE — Progress Notes (Signed)
Synopsis: Referred in for chronic cough by Karen Kindle, FNP  Assessment & Plan:   1. Chronic cough  Patient is presenting for the evaluation of chronic cough that is ongoing for 6 months and potentially longer.  This is in the setting of family history of interstitial lung disease in her grandfather.  On exam, her lungs are clear without wheezing or crackles.  Evaluation of the tonsils show them to be enlarged with some erythema noted on evaluation of the nasal turbinates.  I discussed with Karen Wilcox that the differential for chronic cough is quite broad and includes upper airway cough syndrome (UACS), reflux associated cough (less likely), and asthma.  Her fractional excretion of nitric oxide today in clinic is normal and the picture is not suggestive of asthma given absence of wheezing on exam.    I will obtain pulmonary function testing to assess spirometry, lung volumes, and DLCO to assess for any obstructive defect, restriction, and any drop in DLCO to suggest early onset ILD.  I will hold off on ordering a methacholine challenge test for the time being until the completion of workup.  I did counsel Karen Wilcox on trialing a second-generation antihistamine once daily such as loratadine or cetirizine and seeing if that helps with her symptoms of cough. I will also refer her to ENT for evaluation of tonsillar enlargement.  Given concern for early onset interstitial lung disease, we discussed workup strategies including risk of radiation versus benefit of early diagnosis.  Specifically, we discussed risk of radiation secondary to high-resolution chest CT (radiation to the breast tissue) and the patient is accepting of the risk associated with it.  With risks and benefits explained, and through shared decision making, we will proceed with a high-resolution chest CT to evaluate for early onset interstitial lung disease.  Finally, should the workup reveals cause of the cough, further workup  would include a methacholine challenge test, a double contrast esophagogram to assess for reflux, esophageal manometry, and possibly bronchoscopy for evaluation of rare causes of cough.  - Pulmonary Function Test [facility] Only; Future - Ambulatory referral to ENT - CT CHEST HIGH RESOLUTION; Future - Nitric oxide   Return in about 1 month (around 03/23/2023).  I spent 60 minutes caring for this patient today, including preparing to see the patient, obtaining a medical history , reviewing a separately obtained history, performing a medically appropriate examination and/or evaluation, counseling and educating the patient/family/caregiver, ordering medications, tests, or procedures, and documenting clinical information in the electronic health record  Karen Chute, MD [facility] 02/21/2023 3:30 PM    End of visit medications:  No orders of the defined types were placed in this encounter.    Current Outpatient Medications:    diclofenac Sodium (VOLTAREN ARTHRITIS PAIN) 1 % GEL, Apply 4 g topically 4 (four) times daily., Disp: , Rfl:    Albuterol-Budesonide (AIRSUPRA) 90-80 MCG/ACT AERO, Inhale 1 each into the lungs as needed. (Patient not taking: Reported on 02/21/2023), Disp: , Rfl:    TRELEGY ELLIPTA 200-62.5-25 MCG/ACT AEPB, Inhale 1 each into the lungs daily. (Patient not taking: Reported on 02/21/2023), Disp: , Rfl:    Subjective:   PATIENT ID: Karen Wilcox GENDER: female DOB: [birth year], MRN: [id number]  Chief Complaint  Patient presents with   pulmonary consult    Patient reports worsening cough x 6 weeks. Has completed Airsupra and Ellipta.     HPI  Patient is a pleasant 32 year old female presenting for the evaluation of  chronic cough.  Patient reports symptoms of a few years in duration that she really started noticing a few months ago after her grandfather passed away of interstitial lung disease.  She reports a cough that occurs multiple times an  hour on a daily basis.  It is not triggered by anything and happens out of the blue.  There are no exacerbating factors nor are there any alleviating ones.  She generally takes a moment to catch her breath after coughing.  She denies any shortness of breath with exertion and is able to do all the activities of daily living without dyspnea.  The cough is dry and nonproductive.  The cough is not worse at night nor is it made worse by laying flat; it is actually best in the morning and gets worse throughout the day.  Cough is not exacerbated by any type of food nor has she noticed an association with beverages.  The patient is accompanied today by her mother reports that she had a cough growing up which only got worse over the past few years.  She does not have any personal history of asthma or of any lung disease.  She was able to do sports in school without any limitations.  On further questioning, she does report worsening symptoms when exposed to cold air.  She does not have any seasonal allergies when her symptoms are not made worse by pollen season.  She does not have any rashes.  She denies any symptoms of reflux or heartburn at the moment as well as any symptoms of postnasal drip. Patient was given Trelegy and Airsupra by PCP without any improvement in symptoms and has since stopped them.  Family history is reviewed with the patient and her mother.  They report that her paternal grandfather and 2 of his siblings passed away from idiopathic pulmonary fibrosis.  They were told by her grandfather's doctors that this could be a familial thing.  No other members of the family have ILD as far as they know.  There is report of a early graying in her mother as well as in her aunt.  Patient herself does not have early graying of the hair.  She lives in an apartment that has hard floors without any carpets.  There is no report of any mold in the apartment nor of any water damage.  They do have a cat which has been  present for around 8 months now.  The symptoms are not made worse by exposure to pets.  Ancillary information including prior medications, full medical/surgical/family/social histories, and PFTs (when available) are listed below and have been reviewed.   Review of Systems  Constitutional:  Negative for chills, fever, malaise/fatigue and weight loss.  Respiratory:  Positive for cough. Negative for hemoptysis, sputum production, shortness of breath and wheezing.   Cardiovascular:  Negative for chest pain and palpitations.     Objective:   Vitals:   02/21/23 1452  BP: 100/66  Pulse: 90  Temp: 97.6 F (36.4 C)  TempSrc: Temporal  SpO2: 98%  Weight: 145 lb (65.8 kg)  Height: 5\' 4"  (1.626 m)   98% on RA BMI Readings from Last 3 Encounters:  02/21/23 24.89 kg/m  01/26/23 25.34 kg/m  11/22/21 24.55 kg/m   Wt Readings from Last 3 Encounters:  02/21/23 145 lb (65.8 kg)  01/26/23 147 lb 9.6 oz (67 kg)  11/22/21 143 lb (64.9 kg)    Physical Exam Constitutional:      General: She is  not in acute distress.    Appearance: Normal appearance. She is not ill-appearing.  HENT:     Nose: Congestion (mild congestion noted) present.     Mouth/Throat:     Comments: Noticeably enlarged tonsils bilaterally Cardiovascular:     Rate and Rhythm: Normal rate and regular rhythm.     Pulses: Normal pulses.     Heart sounds: Normal heart sounds.  Pulmonary:     Effort: Pulmonary effort is normal.     Breath sounds: Normal breath sounds. No stridor. No wheezing, rhonchi or rales.  Abdominal:     Palpations: Abdomen is soft.  Musculoskeletal:     Right lower leg: No edema.     Left lower leg: No edema.  Neurological:     General: No focal deficit present.     Mental Status: She is alert and oriented to person, place, and time. Mental status is at baseline.     Ancillary Information    Past Medical History:  Diagnosis Date   Medical history non-contributory      Family History   Problem Relation Age of Onset   Heart disease Father        stents placement   Heart attack Father 109   Hypotension Father    Idiopathic pulmonary fibrosis Maternal Grandmother      Past Surgical History:  Procedure Laterality Date   APPENDECTOMY     APPENDECTOMY     5 years ago?   LAPAROSCOPIC APPENDECTOMY N/A 05/22/2016   Procedure: APPENDECTOMY LAPAROSCOPIC;  Surgeon: Ricarda Frame, MD;  Location: [facility] ORS;  Service: General;  Laterality: N/A;    Social History   Socioeconomic History   Marital status: Married    Spouse name: Berton Bon   Number of children: Not on file   Years of education: Not on file   Highest education level: GED or equivalent  Occupational History   Not on file  Tobacco Use   Smoking status: Never   Smokeless tobacco: Never  Vaping Use   Vaping status: Never Used  Substance and Sexual Activity   Alcohol use: No   Drug use: No   Sexual activity: Yes    Comment: Stopped OCP's 06/2019  Other Topics Concern   Not on file  Social History Narrative   Not on file   Social Determinants of Health   Financial Resource Strain: Low Risk  (01/24/2023)   Overall Financial Resource Strain (CARDIA)    Difficulty of Paying Living Expenses: Not hard at all  Food Insecurity: No Food Insecurity (01/24/2023)   Hunger Vital Sign    Worried About Running Out of Food in the Last Year: Never true    Ran Out of Food in the Last Year: Never true  Transportation Needs: No Transportation Needs (01/24/2023)   PRAPARE - Administrator, Civil Service (Medical): No    Lack of Transportation (Non-Medical): No  Physical Activity: Insufficiently Active (01/24/2023)   Exercise Vital Sign    Days of Exercise per Week: 4 days    Minutes of Exercise per Session: 20 min  Stress: No Stress Concern Present (01/24/2023)   [employer] of Occupational Health - Occupational Stress Questionnaire    Feeling of Stress : Not at all  Social Connections:  Moderately Isolated (01/24/2023)   Social Connection and Isolation Panel [NHANES]    Frequency of Communication with Friends and Family: Once a week    Frequency of Social Gatherings with Friends and Family: Three times a  week    Attends Religious Services: Never    Active Member of Clubs or Organizations: No    Attends Engineer, structural: Not on file    Marital Status: Married  Intimate Partner Violence: Not At Risk (04/07/2021)   Humiliation, Afraid, Rape, and Kick questionnaire    Fear of Current or Ex-Partner: No    Emotionally Abused: No    Physically Abused: No    Sexually Abused: No     Allergies  Allergen Reactions   Zantac [Ranitidine Hcl] Hives     CBC    Component Value Date/Time   WBC 9.7 01/26/2023 1539   WBC 18.1 (H) 10/13/2021 0439   RBC 4.85 01/26/2023 1539   RBC 3.86 (L) 10/13/2021 0439   HGB 13.6 01/26/2023 1539   HCT 40.7 01/26/2023 1539   PLT 337 01/26/2023 1539   MCV 84 01/26/2023 1539   MCV 77 (L) 07/11/2014 1718   MCH 28.0 01/26/2023 1539   MCH 23.3 (L) 10/13/2021 0439   MCHC 33.4 01/26/2023 1539   MCHC 31.3 10/13/2021 0439   RDW 12.8 01/26/2023 1539   RDW 15.6 (H) 07/11/2014 1718   LYMPHSABS 1.8 01/26/2023 1539   LYMPHSABS 1.2 07/11/2014 1718   MONOABS 1.2 (H) 10/13/2021 0439   MONOABS 0.8 07/11/2014 1718   EOSABS 0.1 01/26/2023 1539   EOSABS 0.0 07/11/2014 1718   BASOSABS 0.0 01/26/2023 1539   BASOSABS 0.0 07/11/2014 1718    Pulmonary Functions Testing Results:     No data to display          Outpatient Medications Prior to Visit  Medication Sig Dispense Refill   diclofenac Sodium (VOLTAREN ARTHRITIS PAIN) 1 % GEL Apply 4 g topically 4 (four) times daily.     Albuterol-Budesonide (AIRSUPRA) 90-80 MCG/ACT AERO Inhale 1 each into the lungs as needed. (Patient not taking: Reported on 02/21/2023)     TRELEGY ELLIPTA 200-62.5-25 MCG/ACT AEPB Inhale 1 each into the lungs daily. (Patient not taking: Reported on 02/21/2023)      No facility-administered medications prior to visit.

## 2023-02-22 ENCOUNTER — Ambulatory Visit (INDEPENDENT_AMBULATORY_CARE_PROVIDER_SITE_OTHER): Payer: 59

## 2023-02-22 DIAGNOSIS — Z23 Encounter for immunization: Secondary | ICD-10-CM | POA: Diagnosis not present

## 2023-02-24 ENCOUNTER — Ambulatory Visit
Admission: RE | Admit: 2023-02-24 | Discharge: 2023-02-24 | Disposition: A | Discharge status: Home / Self Care | Payer: 59 | Source: Ambulatory Visit | Attending: Student in an Organized Health Care Education/Training Program | Admitting: Student in an Organized Health Care Education/Training Program

## 2023-02-24 DIAGNOSIS — R053 Chronic cough: Secondary | ICD-10-CM | POA: Diagnosis present

## 2023-02-28 ENCOUNTER — Institutional Professional Consult (permissible substitution): Payer: 59 | Admitting: Student in an Organized Health Care Education/Training Program

## 2023-03-09 ENCOUNTER — Encounter: Payer: Self-pay | Admitting: Student in an Organized Health Care Education/Training Program

## 2023-03-09 ENCOUNTER — Ambulatory Visit: Payer: 59 | Attending: Student in an Organized Health Care Education/Training Program

## 2023-03-09 DIAGNOSIS — R053 Chronic cough: Secondary | ICD-10-CM | POA: Diagnosis present

## 2023-03-09 LAB — PULMONARY FUNCTION TEST ARMC ONLY
DL/VA % pred: 113 %
DL/VA: 5.17 ml/min/mmHg/L
DLCO unc % pred: 104 %
DLCO unc: 23.38 ml/min/mmHg
FEF 25-75 Post: 3.05 L/s
FEF 25-75 Pre: 3.41 L/s
FEF2575-%Change-Post: -10 %
FEF2575-%Pred-Post: 89 %
FEF2575-%Pred-Pre: 100 %
FEV1-%Change-Post: -3 %
FEV1-%Pred-Post: 96 %
FEV1-%Pred-Pre: 100 %
FEV1-Post: 3.05 L
FEV1-Pre: 3.17 L
FEV1FVC-%Change-Post: -2 %
FEV1FVC-%Pred-Pre: 100 %
FEV6-%Change-Post: -1 %
FEV6-%Pred-Post: 99 %
FEV6-%Pred-Pre: 101 %
FEV6-Post: 3.72 L
FEV6-Pre: 3.77 L
FEV6FVC-%Pred-Post: 101 %
FEV6FVC-%Pred-Pre: 101 %
FVC-%Change-Post: -1 %
FVC-%Pred-Post: 98 %
FVC-%Pred-Pre: 100 %
FVC-Post: 3.72 L
FVC-Pre: 3.77 L
Post FEV1/FVC ratio: 82 %
Post FEV6/FVC ratio: 100 %
Pre FEV1/FVC ratio: 84 %
Pre FEV6/FVC Ratio: 100 %
RV % pred: 61 %
RV: 0.87 L
TLC % pred: 91 %
TLC: 4.6 L

## 2023-03-09 MED ORDER — ALBUTEROL SULFATE (2.5 MG/3ML) 0.083% IN NEBU
2.5000 mg | INHALATION_SOLUTION | Freq: Once | RESPIRATORY_TRACT | Status: AC
  Administered 2023-03-09: 2.5 mg via RESPIRATORY_TRACT
  Filled 2023-03-09: qty 3

## 2023-03-13 ENCOUNTER — Ambulatory Visit: Payer: 59 | Admitting: Student in an Organized Health Care Education/Training Program

## 2023-03-13 ENCOUNTER — Encounter: Payer: Self-pay | Admitting: Student in an Organized Health Care Education/Training Program

## 2023-03-13 VITALS — BP 100/60 | HR 68 | Temp 98.0°F | Ht 64.0 in | Wt 142.6 lb

## 2023-03-13 DIAGNOSIS — R053 Chronic cough: Secondary | ICD-10-CM | POA: Diagnosis not present

## 2023-03-13 NOTE — Progress Notes (Signed)
Synopsis: Referred in for chronic cough by Jacky Kindle, FNP  Assessment & Plan:   1. Chronic cough  Patient is presenting for the evaluation of chronic cough that is ongoing for over 6 months and potentially longer.  This is in the setting of family history of interstitial lung disease in her grandfather. On exam, her lungs are clear without wheezing or crackles.  Workup has included a pulmonary function test that was fully normal, as well as a high resolution chest CT that was also within normal without any signs of early ILD. Given this, and the differential that includes UACS, reflux associated cough, and cough variant asthma, I will broaden the workup today to include a methacholine challenge test as well as a double contrast esophagogram to assess for reflux disease. I have asked Karen Wilcox to continue using the second generation anti-histamine for UACS and to attempt to schedule an evaluation with ENT for tonsillar enlargement.   I did counsel Karen Wilcox on trialing a second-generation antihistamine once daily such as loratadine or cetirizine and seeing if that helps with her symptoms of cough. I will also refer her to ENT for evaluation of tonsillar enlargement.  - Pulmonary Function Test - Methacholine challenge test - DG ESOPHAGUS W DOUBLE CM (HD); Future   Return in about 2 months (around 05/13/2023).  I spent 30 minutes caring for this patient today, including preparing to see the patient, obtaining a medical history , reviewing a separately obtained history, performing a medically appropriate examination and/or evaluation, counseling and educating the patient/family/caregiver, ordering medications, tests, or procedures, and documenting clinical information in the electronic health record  Raechel Chute, MD [facility] 03/13/2023 9:27 AM    End of visit medications:  No orders of the defined types were placed in this encounter.    Current  Outpatient Medications:    Albuterol-Budesonide (AIRSUPRA) 90-80 MCG/ACT AERO, Inhale 1 each into the lungs as needed., Disp: , Rfl:    diclofenac Sodium (VOLTAREN ARTHRITIS PAIN) 1 % GEL, Apply 4 g topically 4 (four) times daily., Disp: , Rfl:    TRELEGY ELLIPTA 200-62.5-25 MCG/ACT AEPB, Inhale 1 each into the lungs daily., Disp: , Rfl:    Subjective:   PATIENT ID: Karen Wilcox GENDER: female DOB: 06-16-1990, MRN: [id number]  Chief Complaint  Patient presents with   Follow-up    Dry cough. No shortness of breath or wheezing.     HPI  Patient is a pleasant 32 year old female presenting for follow up of cough.  Initial history obtained during our initial visit showed the following:    Patient reported symptoms of a few years in duration that she really started noticing a few months ago after her grandfather passed away of interstitial lung disease.  She reports a cough that occurs multiple times an hour on a daily basis.  It is not triggered by anything and happens out of the blue.  There are no exacerbating factors nor are there any alleviating ones.  She generally takes a moment to catch her breath after coughing.  She denies any shortness of breath with exertion and is able to do all the activities of daily living without dyspnea.  The cough is dry and nonproductive.  The cough is not worse at night nor is it made worse by laying flat; it is actually best in the morning and gets worse throughout the day.  Cough is not exacerbated by any type of food nor has she noticed an association  with beverages. She does not have any personal history of asthma or of any lung disease.  She was able to do sports in school without any limitations.  On further questioning, she does report worsening symptoms when exposed to cold air.  She does not have any seasonal allergies when her symptoms are not made worse by pollen season.  She does not have any rashes.  She denies any symptoms of reflux or heartburn  at the moment as well as any symptoms of postnasal drip. Patient was given Trelegy and Airsupra by PCP without any improvement in symptoms and has since stopped them.  Today, patient reports that her symptoms have occasionally felt better. She describes days where the cough is noticeably improved, and others where it recurs. On further questioning, changes included elimination of carbonated beverages from her diet. She has gone back to drinking soda but hasn't noticed an association with the cough. She's tried the second generation anti-histamine without much improvement, so it was discontinued.   Family history is reviewed with the patient and her mother during our initial visit.  They report that her paternal grandfather and 2 of his siblings passed away from idiopathic pulmonary fibrosis.  They were told by her grandfather's doctors that this could be a familial thing.  No other members of the family have ILD as far as they know.  There is report of a early graying in her mother as well as in her aunt.  Patient herself does not have early graying of the hair.   She lives in an apartment that has hard floors without any carpets.  There is no report of any mold in the apartment nor of any water damage.  They do have a cat which has been present for around 8 months now.  The symptoms are not made worse by exposure to pets.  Ancillary information including prior medications, full medical/surgical/family/social histories, and PFTs (when available) are listed below and have been reviewed.   Review of Systems  Constitutional:  Negative for chills, fever, malaise/fatigue and weight loss.  Respiratory:  Positive for cough. Negative for hemoptysis, sputum production, shortness of breath and wheezing.   Cardiovascular:  Negative for chest pain and palpitations.     Objective:   Vitals:   03/13/23 0913  BP: 100/60  Pulse: 68  Temp: 98 F (36.7 C)  TempSrc: Temporal  SpO2: 99%  Weight: 142 lb 9.6 oz  (64.7 kg)  Height: 5\' 4"  (1.626 m)   99% on RA  BMI Readings from Last 3 Encounters:  03/13/23 24.48 kg/m  02/21/23 24.89 kg/m  01/26/23 25.34 kg/m   Wt Readings from Last 3 Encounters:  03/13/23 142 lb 9.6 oz (64.7 kg)  02/21/23 145 lb (65.8 kg)  01/26/23 147 lb 9.6 oz (67 kg)    Physical Exam Constitutional:      General: She is not in acute distress.    Appearance: Normal appearance. She is not ill-appearing.  HENT:     Mouth/Throat:     Comments: Noticeably enlarged tonsils bilaterally Cardiovascular:     Rate and Rhythm: Normal rate and regular rhythm.     Pulses: Normal pulses.     Heart sounds: Normal heart sounds.  Pulmonary:     Effort: Pulmonary effort is normal.     Breath sounds: Normal breath sounds. No stridor. No wheezing, rhonchi or rales.  Abdominal:     Palpations: Abdomen is soft.  Musculoskeletal:     Right lower leg: No edema.  Left lower leg: No edema.  Neurological:     General: No focal deficit present.     Mental Status: She is alert and oriented to person, place, and time. Mental status is at baseline.       Ancillary Information    Past Medical History:  Diagnosis Date   Medical history non-contributory      Family History  Problem Relation Age of Onset   Heart disease Father        stents placement   Heart attack Father 71   Hypotension Father    Idiopathic pulmonary fibrosis Maternal Grandmother      Past Surgical History:  Procedure Laterality Date   APPENDECTOMY     APPENDECTOMY     5 years ago?   LAPAROSCOPIC APPENDECTOMY N/A 05/22/2016   Procedure: APPENDECTOMY LAPAROSCOPIC;  Surgeon: Ricarda Frame, MD;  Location: [facility] ORS;  Service: General;  Laterality: N/A;    Social History   Socioeconomic History   Marital status: Married    Spouse name: Berton Bon   Number of children: Not on file   Years of education: Not on file   Highest education level: GED or equivalent  Occupational History   Not  on file  Tobacco Use   Smoking status: Never   Smokeless tobacco: Never  Vaping Use   Vaping status: Never Used  Substance and Sexual Activity   Alcohol use: No   Drug use: No   Sexual activity: Yes    Comment: Stopped OCP's 06/2019  Other Topics Concern   Not on file  Social History Narrative   Not on file   Social Determinants of Health   Financial Resource Strain: Low Risk  (01/24/2023)   Overall Financial Resource Strain (CARDIA)    Difficulty of Paying Living Expenses: Not hard at all  Food Insecurity: No Food Insecurity (01/24/2023)   Hunger Vital Sign    Worried About Running Out of Food in the Last Year: Never true    Ran Out of Food in the Last Year: Never true  Transportation Needs: No Transportation Needs (01/24/2023)   PRAPARE - Administrator, Civil Service (Medical): No    Lack of Transportation (Non-Medical): No  Physical Activity: Insufficiently Active (01/24/2023)   Exercise Vital Sign    Days of Exercise per Week: 4 days    Minutes of Exercise per Session: 20 min  Stress: No Stress Concern Present (01/24/2023)   [employer] of Occupational Health - Occupational Stress Questionnaire    Feeling of Stress : Not at all  Social Connections: Moderately Isolated (01/24/2023)   Social Connection and Isolation Panel [NHANES]    Frequency of Communication with Friends and Family: Once a week    Frequency of Social Gatherings with Friends and Family: Three times a week    Attends Religious Services: Never    Active Member of Clubs or Organizations: No    Attends Banker Meetings: Not on file    Marital Status: Married  Catering manager Violence: Not At Risk (04/07/2021)   Humiliation, Afraid, Rape, and Kick questionnaire    Fear of Current or Ex-Partner: No    Emotionally Abused: No    Physically Abused: No    Sexually Abused: No     Allergies  Allergen Reactions   Zantac [Ranitidine Hcl] Hives     CBC    Component Value  Date/Time   WBC 9.7 01/26/2023 1539   WBC 18.1 (H) 10/13/2021 0439   RBC  4.85 01/26/2023 1539   RBC 3.86 (L) 10/13/2021 0439   HGB 13.6 01/26/2023 1539   HCT 40.7 01/26/2023 1539   PLT 337 01/26/2023 1539   MCV 84 01/26/2023 1539   MCV 77 (L) 07/11/2014 1718   MCH 28.0 01/26/2023 1539   MCH 23.3 (L) 10/13/2021 0439   MCHC 33.4 01/26/2023 1539   MCHC 31.3 10/13/2021 0439   RDW 12.8 01/26/2023 1539   RDW 15.6 (H) 07/11/2014 1718   LYMPHSABS 1.8 01/26/2023 1539   LYMPHSABS 1.2 07/11/2014 1718   MONOABS 1.2 (H) 10/13/2021 0439   MONOABS 0.8 07/11/2014 1718   EOSABS 0.1 01/26/2023 1539   EOSABS 0.0 07/11/2014 1718   BASOSABS 0.0 01/26/2023 1539   BASOSABS 0.0 07/11/2014 1718    Pulmonary Functions Testing Results:    Latest Ref Rng & Units 03/09/2023    4:45 PM  PFT Results  FVC-Pre L 3.77   FVC-Predicted Pre % 100   FVC-Post L 3.72   FVC-Predicted Post % 98   Pre FEV1/FVC % % 84   Post FEV1/FCV % % 82   FEV1-Pre L 3.17   FEV1-Predicted Pre % 100   FEV1-Post L 3.05   DLCO uncorrected ml/min/mmHg 23.38   DLCO UNC% % 104   DLVA Predicted % 113   TLC L 4.60   TLC % Predicted % 91   RV % Predicted % 61     Outpatient Medications Prior to Visit  Medication Sig Dispense Refill   Albuterol-Budesonide (AIRSUPRA) 90-80 MCG/ACT AERO Inhale 1 each into the lungs as needed.     diclofenac Sodium (VOLTAREN ARTHRITIS PAIN) 1 % GEL Apply 4 g topically 4 (four) times daily.     TRELEGY ELLIPTA 200-62.5-25 MCG/ACT AEPB Inhale 1 each into the lungs daily.     No facility-administered medications prior to visit.

## 2023-03-17 ENCOUNTER — Other Ambulatory Visit: Payer: 59

## 2023-04-04 ENCOUNTER — Ambulatory Visit: Payer: 59 | Attending: Student in an Organized Health Care Education/Training Program

## 2023-04-04 DIAGNOSIS — R053 Chronic cough: Secondary | ICD-10-CM

## 2023-04-04 LAB — PULMONARY FUNCTION TEST ARMC ONLY
FEF 25-75 Post: 3.46 L/s
FEF 25-75 Pre: 3 L/s
FEF2575-%Change-Post: 15 %
FEF2575-%Pred-Post: 102 %
FEF2575-%Pred-Pre: 89 %
FEV1-%Change-Post: 5 %
FEV1-%Pred-Post: 102 %
FEV1-%Pred-Pre: 96 %
FEV1-Post: 3.18 L
FEV1-Pre: 3.01 L
FEV1FVC-%Change-Post: 5 %
FEV1FVC-%Pred-Pre: 94 %
FEV6-%Change-Post: 0 %
FEV6-%Pred-Post: 103 %
FEV6-%Pred-Pre: 103 %
FEV6-Post: 3.81 L
FEV6-Pre: 3.8 L
FEV6FVC-%Pred-Post: 101 %
FEV6FVC-%Pred-Pre: 101 %
FVC-%Change-Post: 0 %
FVC-%Pred-Post: 102 %
FVC-%Pred-Pre: 102 %
FVC-Post: 3.81 L
FVC-Pre: 3.8 L
Post FEV1/FVC ratio: 84 %
Post FEV6/FVC ratio: 100 %
Pre FEV1/FVC ratio: 79 %
Pre FEV6/FVC Ratio: 100 %

## 2023-04-04 MED ORDER — METHACHOLINE 0.0625 MG/ML NEB SOLN
3.0000 mL | Freq: Once | RESPIRATORY_TRACT | Status: AC
  Administered 2023-04-04: 0.1875 mg via RESPIRATORY_TRACT
  Filled 2023-04-04: qty 3

## 2023-04-04 MED ORDER — METHACHOLINE 1 MG/ML NEB SOLN
3.0000 mL | Freq: Once | RESPIRATORY_TRACT | Status: AC
  Administered 2023-04-04: 3 mg via RESPIRATORY_TRACT
  Filled 2023-04-04: qty 3

## 2023-04-04 MED ORDER — METHACHOLINE 4 MG/ML NEB SOLN
3.0000 mL | Freq: Once | RESPIRATORY_TRACT | Status: AC
  Administered 2023-04-04: 12 mg via RESPIRATORY_TRACT
  Filled 2023-04-04: qty 3

## 2023-04-04 MED ORDER — METHACHOLINE 0.25 MG/ML NEB SOLN
3.0000 mL | Freq: Once | RESPIRATORY_TRACT | Status: AC
Start: 2023-04-04 — End: 2023-04-04
  Administered 2023-04-04: 0.75 mg via RESPIRATORY_TRACT
  Filled 2023-04-04: qty 3

## 2023-04-04 MED ORDER — METHACHOLINE 0 MG/ML NEB SOLN
3.0000 mL | Freq: Once | RESPIRATORY_TRACT | Status: AC
  Administered 2023-04-04: 3 mL via RESPIRATORY_TRACT
  Filled 2023-04-04: qty 3

## 2023-04-04 MED ORDER — ALBUTEROL SULFATE (2.5 MG/3ML) 0.083% IN NEBU
2.5000 mg | INHALATION_SOLUTION | Freq: Once | RESPIRATORY_TRACT | Status: AC
  Administered 2023-04-04: 2.5 mg via RESPIRATORY_TRACT
  Filled 2023-04-04: qty 3

## 2023-04-04 MED ORDER — METHACHOLINE 16 MG/ML NEB SOLN
3.0000 mL | Freq: Once | RESPIRATORY_TRACT | Status: AC
  Administered 2023-04-04: 48 mg via RESPIRATORY_TRACT
  Filled 2023-04-04: qty 3

## 2023-04-17 ENCOUNTER — Ambulatory Visit
Admission: RE | Admit: 2023-04-17 | Discharge: 2023-04-17 | Disposition: A | Discharge status: Home / Self Care | Payer: 59 | Source: Ambulatory Visit | Attending: Student in an Organized Health Care Education/Training Program | Admitting: Student in an Organized Health Care Education/Training Program

## 2023-04-17 DIAGNOSIS — R053 Chronic cough: Secondary | ICD-10-CM | POA: Diagnosis present

## 2023-05-03 ENCOUNTER — Encounter: Payer: Self-pay | Admitting: Family Medicine

## 2023-05-05 ENCOUNTER — Other Ambulatory Visit: Payer: Self-pay | Admitting: Family Medicine

## 2023-05-05 ENCOUNTER — Other Ambulatory Visit: Payer: Self-pay | Admitting: *Deleted

## 2023-05-05 DIAGNOSIS — R7303 Prediabetes: Secondary | ICD-10-CM

## 2023-05-10 ENCOUNTER — Other Ambulatory Visit: Payer: Self-pay | Admitting: *Deleted

## 2023-05-10 NOTE — Telephone Encounter (Signed)
Looks like order was released. If patient going to [facility] they should be able to see it

## 2023-05-29 ENCOUNTER — Ambulatory Visit: Payer: 59 | Admitting: Student in an Organized Health Care Education/Training Program

## 2023-06-14 LAB — HEMOGLOBIN A1C
Est. average glucose Bld gHb Est-mCnc: 117 mg/dL
Hgb A1c MFr Bld: 5.7 % — ABNORMAL HIGH (ref 4.8–5.6)

## 2023-10-14 IMAGING — US US OB COMP +14 WK
1 series · 15 of 28 positions shown · non-contrast
Comparison: none

CLINICAL DATA: 30-year-old pregnant female presents for fetal
anatomic survey.

EXAM:
OBSTETRICAL ULTRASOUND >14 WKS

[Series 1: us ob comp + 14 wk · 15 of 111 slices shown]
[im 1/111]
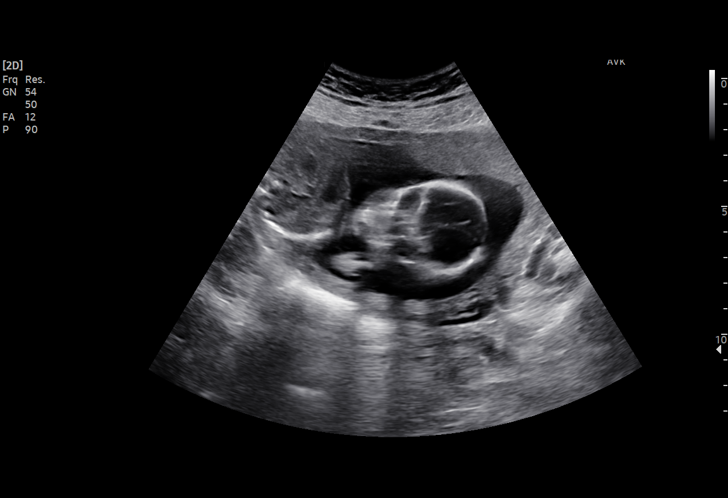
[im 9/111]
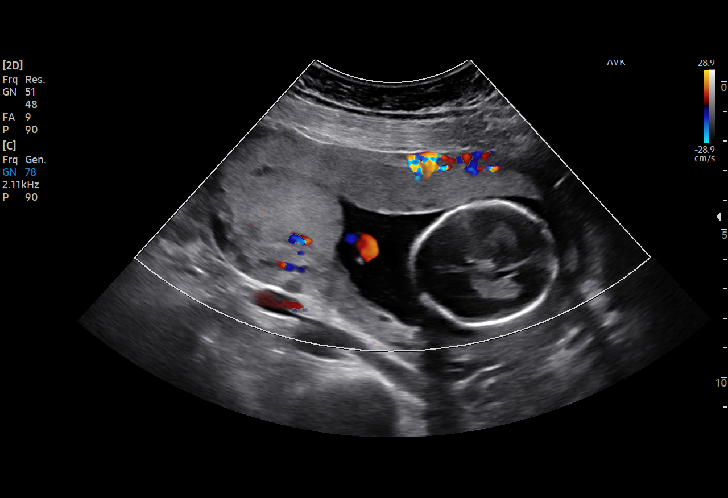
[im 17/111]
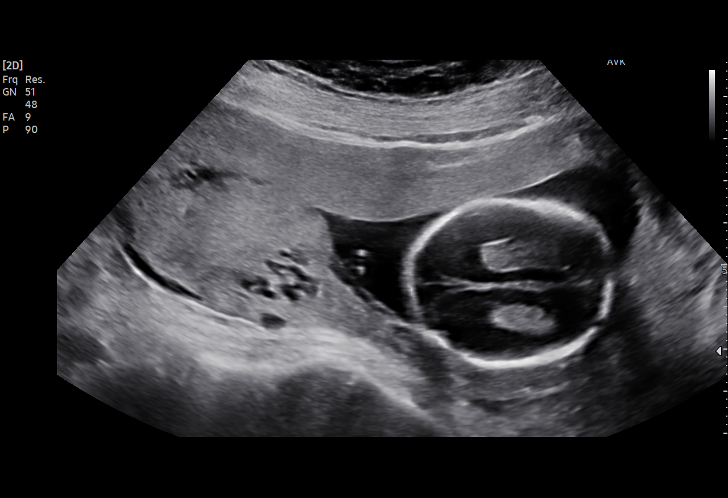
[im 25/111]
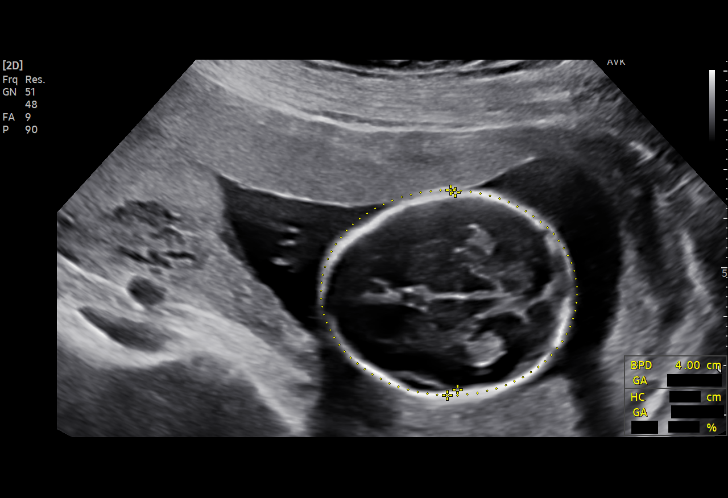
[im 33/111]
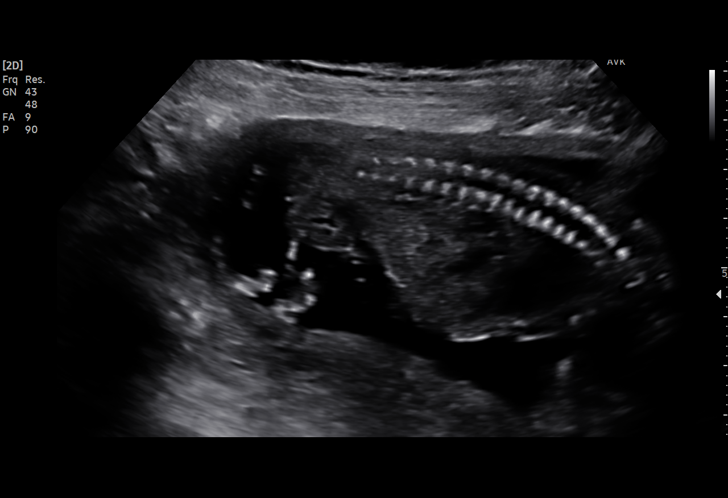
[im 41/111]
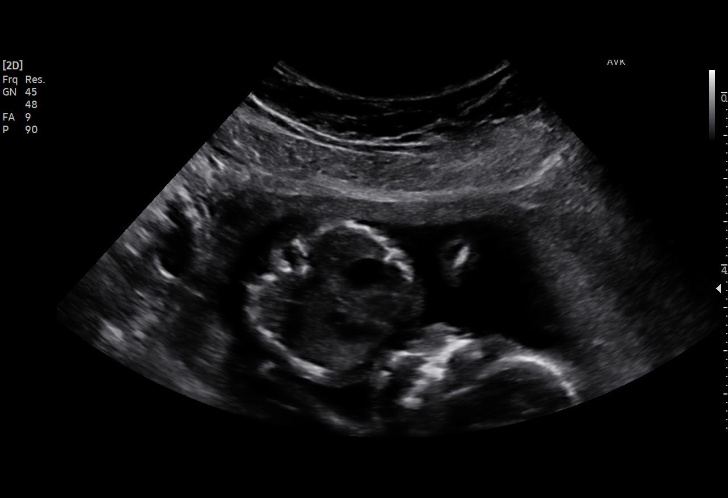
[im 49/111]
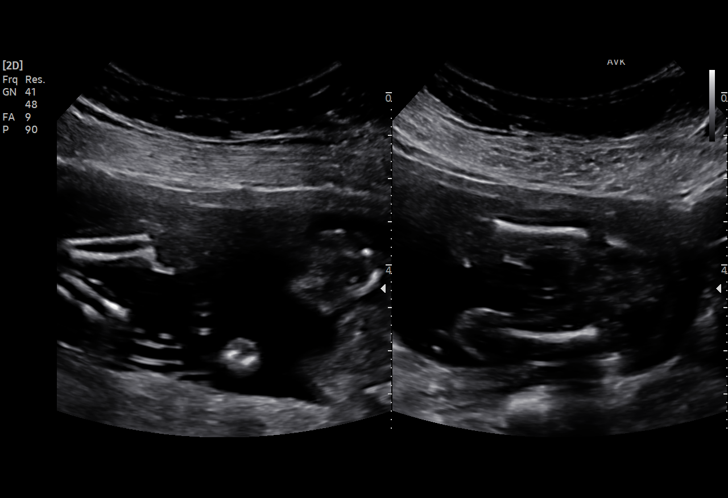
[im 58/111]
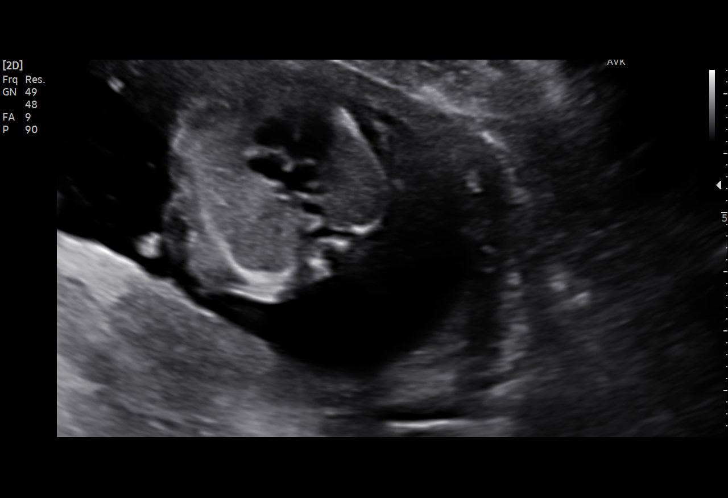
[im 62/111]
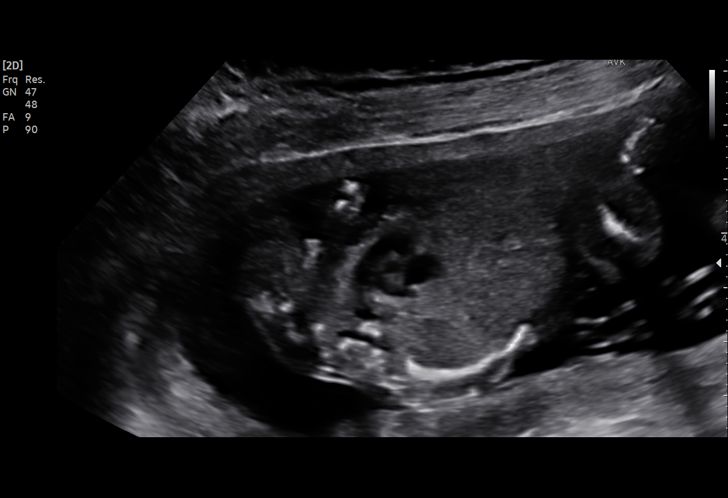
[im 70/111]
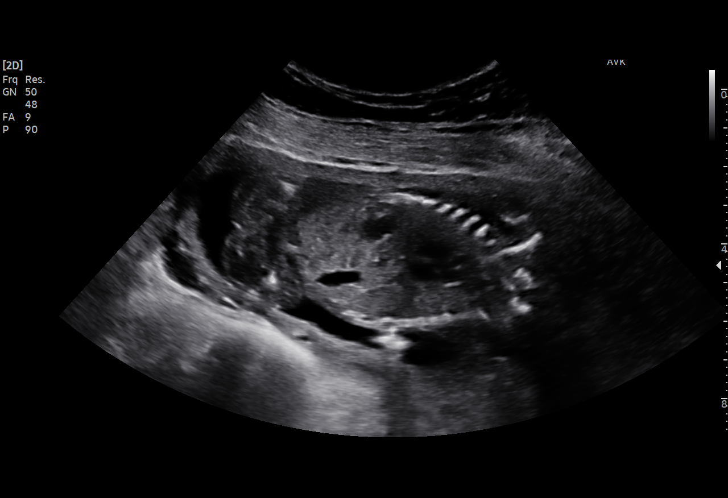
[im 78/111]
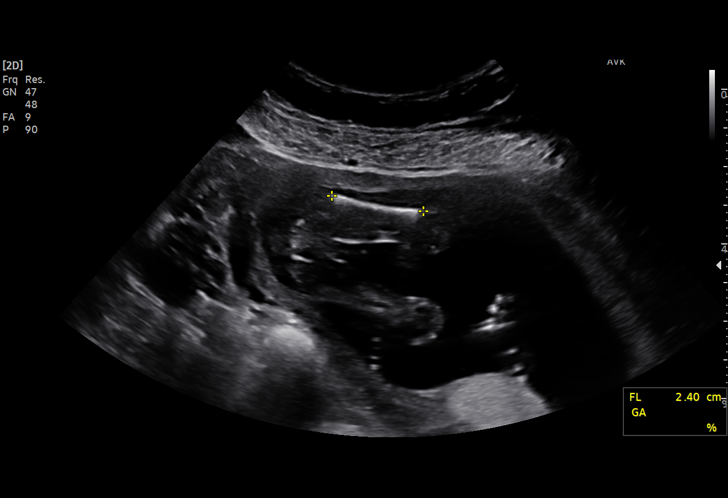
[im 86/111]
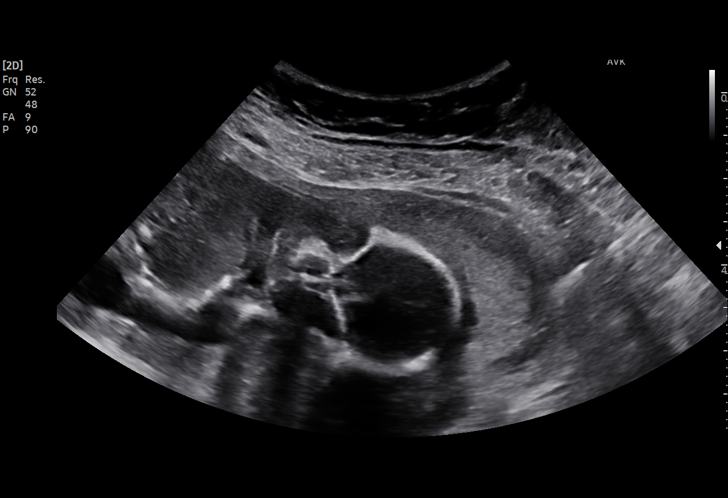
[im 94/111]
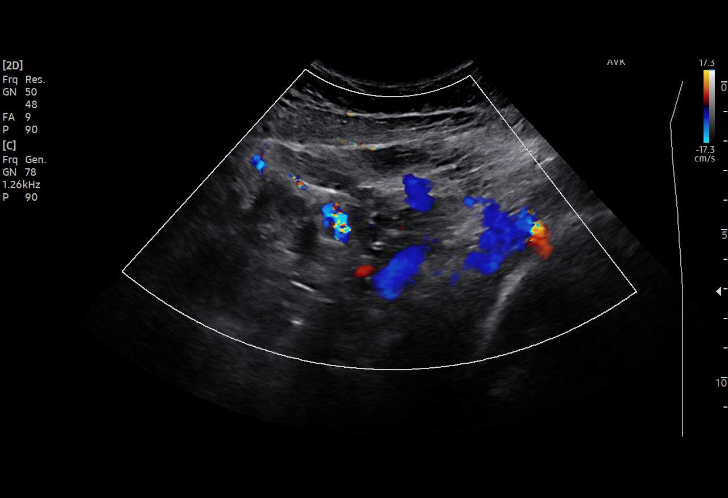
[im 102/111]
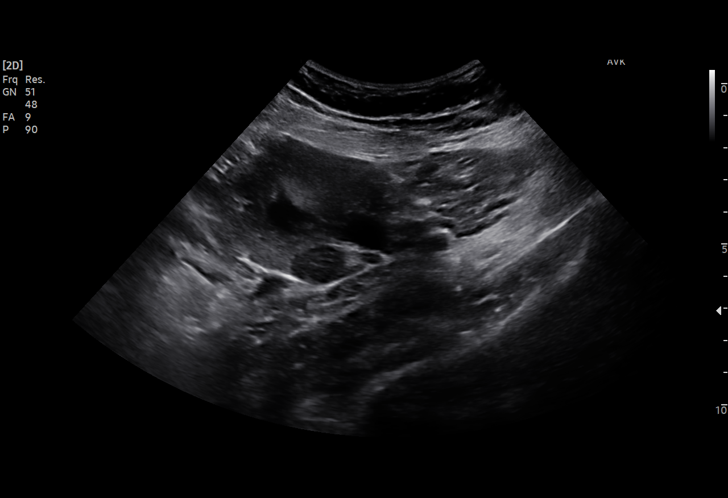
[im 111/111]
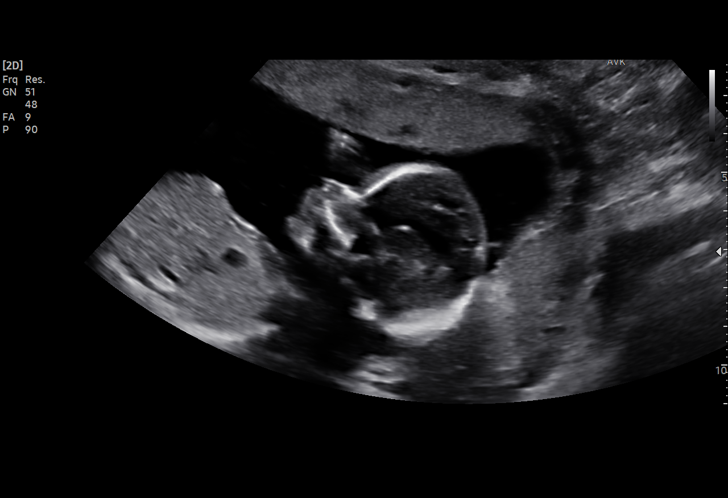

[15 of 28 positions shown; findings below may reference images not displayed]

FINDINGS: Number of Fetuses: 1

Heart Rate:  143 bpm

Movement: Yes

Presentation: Variable

Previa: No

Placental Location: Fundal

Amniotic Fluid (Subjective): Normal

Amniotic Fluid (Objective):

Vertical pocket = 3.4cm

FETAL BIOMETRY

BPD: 4.2cm 18w 5d

HC:   14.9cm 18w 0d

AC:   12.9cm 18w 3d

FL:   2.4cm 17w 2d

Current Mean GA: 18w 0d US EDC: 11/03/2021

Assigned GA:  18w 2d Assigned EDC: 11/01/2021

Estimated Fetal Weight:  216g

FETAL ANATOMY

Lateral Ventricles: Appears normal

Thalami/CSP: Appears normal

Posterior Fossa:  Appears normal

Nuchal Region: Appears normal   NFT= 2.9 mm

Upper Lip: Appears normal

Spine: Appears normal

4 Chamber Heart on Left: Appears normal

LVOT: Appears normal

RVOT: Appears normal

Stomach on Left: Appears normal

3 Vessel Cord: Appears normal

Cord Insertion site: Appears normal

Kidneys: Appears normal

Bladder: Appears normal

Extremities: Appears normal, 4 extremities demonstrated

Sex: Male external genitalia

Maternal Findings:

Cervix: Cervix length approximately 3.9 cm on transabdominal views,
with no evidence of internal cervical funneling.
IMPRESSION: 1. Single living intrauterine gestation with variable lie at 18
weeks 2 days by average ultrasound age, concordant with assigned
dating.
2. No fetal or maternal abnormalities detected.

## 2024-03-19 ENCOUNTER — Other Ambulatory Visit: Payer: Self-pay | Admitting: Medical Genetics

## 2024-03-23 ENCOUNTER — Other Ambulatory Visit: Payer: Self-pay

## 2024-03-30 ENCOUNTER — Other Ambulatory Visit
Admission: RE | Admit: 2024-03-30 | Discharge: 2024-03-30 | Disposition: A | Payer: Self-pay | Source: Ambulatory Visit | Attending: Medical Genetics | Admitting: Medical Genetics

## 2024-04-17 LAB — GENECONNECT MOLECULAR SCREEN: Genetic Analysis Overall Interpretation: NEGATIVE
